# Patient Record
Sex: Female | Born: 1970
Health system: Southern US, Community
[De-identification: ages and names within clinical notes are randomized; demographics above are authoritative.]

## PROBLEM LIST (undated history)

## (undated) DIAGNOSIS — Q438 Other specified congenital malformations of intestine: Secondary | ICD-10-CM

## (undated) DIAGNOSIS — D126 Benign neoplasm of colon, unspecified: Secondary | ICD-10-CM

## (undated) DIAGNOSIS — R0600 Dyspnea, unspecified: Secondary | ICD-10-CM

## (undated) DIAGNOSIS — J329 Chronic sinusitis, unspecified: Secondary | ICD-10-CM

## (undated) DIAGNOSIS — S060X9A Concussion with loss of consciousness of unspecified duration, initial encounter: Secondary | ICD-10-CM

## (undated) DIAGNOSIS — Z8489 Family history of other specified conditions: Secondary | ICD-10-CM

## (undated) DIAGNOSIS — R51 Headache: Secondary | ICD-10-CM

## (undated) DIAGNOSIS — F419 Anxiety disorder, unspecified: Secondary | ICD-10-CM

## (undated) HISTORY — DX: Chronic sinusitis, unspecified: J32.9

## (undated) HISTORY — DX: Anxiety disorder, unspecified: F41.9

## (undated) HISTORY — PX: OTHER SURGICAL HISTORY: SHX169

## (undated) HISTORY — DX: Headache: R51

## (undated) HISTORY — DX: Benign neoplasm of colon, unspecified: D12.6

## (undated) HISTORY — DX: Other specified congenital malformations of intestine: Q43.8

## (undated) HISTORY — PX: NO PAST SURGERIES: SHX2092

---

## 2002-01-15 ENCOUNTER — Other Ambulatory Visit: Admission: RE | Admit: 2002-01-15 | Discharge: 2002-01-15 | Payer: Self-pay | Admitting: Obstetrics and Gynecology

## 2002-12-26 ENCOUNTER — Encounter: Payer: Self-pay | Admitting: Family Medicine

## 2002-12-26 ENCOUNTER — Encounter: Admission: RE | Admit: 2002-12-26 | Discharge: 2002-12-26 | Payer: Self-pay | Admitting: Family Medicine

## 2003-01-21 ENCOUNTER — Other Ambulatory Visit: Admission: RE | Admit: 2003-01-21 | Discharge: 2003-01-21 | Payer: Self-pay | Admitting: Obstetrics and Gynecology

## 2004-02-08 ENCOUNTER — Other Ambulatory Visit: Admission: RE | Admit: 2004-02-08 | Discharge: 2004-02-08 | Payer: Self-pay | Admitting: Obstetrics and Gynecology

## 2005-03-02 ENCOUNTER — Other Ambulatory Visit: Admission: RE | Admit: 2005-03-02 | Discharge: 2005-03-02 | Payer: Self-pay | Admitting: Obstetrics and Gynecology

## 2009-01-15 ENCOUNTER — Encounter: Admission: RE | Admit: 2009-01-15 | Discharge: 2009-01-15 | Payer: Self-pay | Admitting: Obstetrics and Gynecology

## 2010-01-17 ENCOUNTER — Encounter: Admission: RE | Admit: 2010-01-17 | Discharge: 2010-01-17 | Payer: Self-pay | Admitting: Obstetrics and Gynecology

## 2011-02-02 ENCOUNTER — Other Ambulatory Visit: Payer: Self-pay | Admitting: Obstetrics and Gynecology

## 2011-02-02 DIAGNOSIS — Z1231 Encounter for screening mammogram for malignant neoplasm of breast: Secondary | ICD-10-CM

## 2011-03-02 ENCOUNTER — Ambulatory Visit: Payer: Self-pay

## 2011-03-03 ENCOUNTER — Ambulatory Visit
Admission: RE | Admit: 2011-03-03 | Discharge: 2011-03-03 | Disposition: A | Discharge status: Home / Self Care | Payer: Managed Care, Other (non HMO) | Source: Ambulatory Visit | Attending: Obstetrics and Gynecology | Admitting: Obstetrics and Gynecology

## 2011-03-03 DIAGNOSIS — Z1231 Encounter for screening mammogram for malignant neoplasm of breast: Secondary | ICD-10-CM

## 2011-09-05 ENCOUNTER — Ambulatory Visit: Payer: Self-pay | Admitting: Obstetrics and Gynecology

## 2011-10-10 ENCOUNTER — Other Ambulatory Visit: Payer: Self-pay | Admitting: Obstetrics and Gynecology

## 2011-10-10 MED ORDER — NORGESTIM-ETH ESTRAD TRIPHASIC 0.18/0.215/0.25 MG-35 MCG PO TABS: 1.0000 | ORAL_TABLET | Freq: Every day | ORAL | Status: DC

## 2011-10-10 NOTE — Telephone Encounter (Signed)
Pt aex sch for August.  Refilled BCP 1 time with 1 refill.  ld

## 2011-10-10 NOTE — Telephone Encounter (Signed)
LAURA/SR PT °

## 2011-11-21 ENCOUNTER — Ambulatory Visit: Payer: Self-pay | Admitting: Obstetrics and Gynecology

## 2011-11-22 ENCOUNTER — Ambulatory Visit: Payer: Self-pay | Admitting: Obstetrics and Gynecology

## 2012-08-14 ENCOUNTER — Other Ambulatory Visit: Payer: Self-pay

## 2012-08-14 DIAGNOSIS — Z1231 Encounter for screening mammogram for malignant neoplasm of breast: Secondary | ICD-10-CM

## 2012-09-17 ENCOUNTER — Ambulatory Visit: Payer: Managed Care, Other (non HMO)

## 2012-12-31 ENCOUNTER — Ambulatory Visit (INDEPENDENT_AMBULATORY_CARE_PROVIDER_SITE_OTHER): Payer: Managed Care, Other (non HMO) | Admitting: General Practice

## 2012-12-31 ENCOUNTER — Other Ambulatory Visit: Payer: Self-pay | Admitting: General Practice

## 2012-12-31 ENCOUNTER — Encounter: Payer: Self-pay | Admitting: General Practice

## 2012-12-31 VITALS — BP 133/84 | HR 82 | Temp 98.5°F | Ht 63.0 in | Wt 143.0 lb

## 2012-12-31 DIAGNOSIS — Z23 Encounter for immunization: Secondary | ICD-10-CM

## 2012-12-31 DIAGNOSIS — Z0000 Encounter for general adult medical examination without abnormal findings: Secondary | ICD-10-CM

## 2012-12-31 DIAGNOSIS — Z01419 Encounter for gynecological examination (general) (routine) without abnormal findings: Secondary | ICD-10-CM

## 2012-12-31 DIAGNOSIS — Z124 Encounter for screening for malignant neoplasm of cervix: Secondary | ICD-10-CM

## 2012-12-31 LAB — POCT CBC
Granulocyte percent: 80.7 %G — AB (ref 37–80)
Hemoglobin: 12.8 g/dL (ref 12.2–16.2)
MCH, POC: 30.2 pg (ref 27–31.2)
MCV: 88.8 fL (ref 80–97)
MPV: 6.6 fL (ref 0–99.8)
POC Granulocyte: 4.3 (ref 2–6.9)
POC LYMPH PERCENT: 13.9 %L (ref 10–50)
Platelet Count, POC: 311 10*3/uL (ref 142–424)
RBC: 4.2 M/uL (ref 4.04–5.48)
RDW, POC: 13 %
WBC: 5.3 10*3/uL (ref 4.6–10.2)

## 2012-12-31 NOTE — Patient Instructions (Signed)

## 2012-12-31 NOTE — Progress Notes (Signed)
  Subjective:    Patient ID: Brenda Walker, female    DOB: [birth year], 42 y.o.   MRN: [id number]  HPI Patient presents today for annual physical. She denies any complaints or concerns at this time. Reports eating healthier and beginning a regular exercise routine. Reports taking an oral birth control contraceptive and tolerating well.     Review of Systems  Constitutional: Negative for fever and chills.  HENT: Negative for neck pain and neck stiffness.   Respiratory: Negative for chest tightness and shortness of breath.   Cardiovascular: Negative for chest pain.  Gastrointestinal: Negative for nausea, vomiting, abdominal pain, diarrhea, constipation and blood in stool.  Genitourinary: Negative for dysuria, hematuria and difficulty urinating.  Musculoskeletal: Negative for back pain.  Neurological: Negative for dizziness, weakness and headaches.       Objective:   Physical Exam  Constitutional: She is oriented to person, place, and time. She appears well-developed and well-nourished.  HENT:  Head: Normocephalic and atraumatic.  Right Ear: External ear normal.  Left Ear: External ear normal.  Nose: Nose normal.  Mouth/Throat: Oropharynx is clear and moist.  Eyes: Conjunctivae and EOM are normal. Pupils are equal, round, and reactive to light.  Neck: Normal range of motion. Neck supple. No thyromegaly present.  Cardiovascular: Normal rate, regular rhythm, normal heart sounds and intact distal pulses.   Pulmonary/Chest: Effort normal and breath sounds normal. No respiratory distress. She exhibits no tenderness. Right breast exhibits no inverted nipple, no mass, no nipple discharge, no skin change and no tenderness. Left breast exhibits no inverted nipple, no mass, no nipple discharge, no skin change and no tenderness. Breasts are symmetrical.  Abdominal: Soft. Bowel sounds are normal. She exhibits no distension. There is no tenderness.  Genitourinary: Vagina normal and uterus  normal. No breast swelling, tenderness, discharge or bleeding. No labial fusion. There is no rash, tenderness, lesion or injury on the right labia. There is no rash, tenderness, lesion or injury on the left labia. Uterus is not deviated, not enlarged, not fixed and not tender. Cervix exhibits no motion tenderness, no discharge and no friability. Right adnexum displays no mass, no tenderness and no fullness. Left adnexum displays no mass, no tenderness and no fullness. No erythema, tenderness or bleeding around the vagina. No foreign body around the vagina. No signs of injury around the vagina. No vaginal discharge found.  Musculoskeletal: She exhibits no edema and no tenderness.  Lymphadenopathy:    She has no cervical adenopathy.  Neurological: She is alert and oriented to person, place, and time.  Skin: Skin is warm and dry.  Psychiatric: She has a normal mood and affect.          Assessment & Plan:  1. Annual physical exam  - POCT CBC - CMP14+EGFR - Pap IG w/ reflex to HPV when ASC-U - NMR, lipoprofile  2. Need for prophylactic vaccination and inoculation against influenza -discussed healthy eating and regular exercise -RTO in 1 year for annual exam -Patient verbalized understanding -Coralie Keens, FNP-C

## 2013-01-01 ENCOUNTER — Ambulatory Visit: Payer: Managed Care, Other (non HMO)

## 2013-01-01 ENCOUNTER — Other Ambulatory Visit: Payer: Self-pay | Admitting: General Practice

## 2013-01-02 LAB — CMP14+EGFR
ALT: 17 IU/L (ref 0–32)
Albumin/Globulin Ratio: 1.5 (ref 1.1–2.5)
Albumin: 4.2 g/dL (ref 3.5–5.5)
Alkaline Phosphatase: 75 IU/L (ref 39–117)
BUN/Creatinine Ratio: 13 (ref 9–23)
BUN: 11 mg/dL (ref 6–24)
CO2: 22 mmol/L (ref 18–29)
Calcium: 9.9 mg/dL (ref 8.7–10.2)
Chloride: 103 mmol/L (ref 97–108)
Globulin, Total: 2.8 g/dL (ref 1.5–4.5)
Glucose: 90 mg/dL (ref 65–99)

## 2013-01-02 LAB — NMR, LIPOPROFILE
Cholesterol: 183 mg/dL (ref <200)
HDL Particle Number: 44.2 umol/L (ref >=30.5)
LDL Particle Number: 1584 nmol/L — ABNORMAL HIGH (ref <1000)
LDL Size: 20.9 nm (ref >20.5)
LDLC SERPL CALC-MCNC: 101 mg/dL — ABNORMAL HIGH (ref <100)
Triglycerides by NMR: 95 mg/dL (ref <150)

## 2013-01-03 ENCOUNTER — Other Ambulatory Visit: Payer: Self-pay | Admitting: General Practice

## 2013-01-03 ENCOUNTER — Ambulatory Visit
Admission: RE | Admit: 2013-01-03 | Discharge: 2013-01-03 | Disposition: A | Discharge status: Home / Self Care | Payer: Managed Care, Other (non HMO) | Source: Ambulatory Visit

## 2013-01-03 ENCOUNTER — Telehealth: Payer: Self-pay | Admitting: General Practice

## 2013-01-03 DIAGNOSIS — IMO0001 Reserved for inherently not codable concepts without codable children: Secondary | ICD-10-CM

## 2013-01-03 DIAGNOSIS — Z1231 Encounter for screening mammogram for malignant neoplasm of breast: Secondary | ICD-10-CM

## 2013-01-03 LAB — PAP IG W/ RFLX HPV ASCU: PAP Smear Comment: 0

## 2013-01-03 MED ORDER — NORGESTIM-ETH ESTRAD TRIPHASIC 0.18/0.215/0.25 MG-35 MCG PO TABS: 1.0000 | ORAL_TABLET | Freq: Every day | ORAL | Status: DC

## 2013-01-03 NOTE — Telephone Encounter (Signed)
Already sent.

## 2013-01-03 NOTE — Telephone Encounter (Signed)
done

## 2013-01-16 ENCOUNTER — Ambulatory Visit (INDEPENDENT_AMBULATORY_CARE_PROVIDER_SITE_OTHER): Payer: Managed Care, Other (non HMO) | Admitting: General Practice

## 2013-01-16 ENCOUNTER — Encounter: Payer: Self-pay | Admitting: General Practice

## 2013-01-16 ENCOUNTER — Telehealth: Payer: Self-pay | Admitting: General Practice

## 2013-01-16 VITALS — BP 119/74 | HR 83 | Temp 97.0°F | Ht 63.0 in | Wt 142.5 lb

## 2013-01-16 DIAGNOSIS — N390 Urinary tract infection, site not specified: Secondary | ICD-10-CM

## 2013-01-16 DIAGNOSIS — IMO0001 Reserved for inherently not codable concepts without codable children: Secondary | ICD-10-CM

## 2013-01-16 DIAGNOSIS — R350 Frequency of micturition: Secondary | ICD-10-CM

## 2013-01-16 LAB — POCT UA - MICROSCOPIC ONLY
Bacteria, U Microscopic: NEGATIVE
Crystals, Ur, HPF, POC: NEGATIVE
Mucus, UA: NEGATIVE

## 2013-01-16 LAB — POCT URINALYSIS DIPSTICK
Bilirubin, UA: NEGATIVE
Glucose, UA: NEGATIVE
Ketones, UA: NEGATIVE
Spec Grav, UA: 1.01

## 2013-01-16 MED ORDER — CIPROFLOXACIN HCL 500 MG PO TABS: 500.0000 mg | ORAL_TABLET | Freq: Two times a day (BID) | ORAL | Status: DC

## 2013-01-16 NOTE — Patient Instructions (Signed)
Urinary Tract Infection  Urinary tract infections (UTIs) can develop anywhere along your urinary tract. Your urinary tract is your body's drainage system for removing wastes and extra water. Your urinary tract includes two kidneys, two ureters, a bladder, and a urethra. Your kidneys are a pair of bean-shaped organs. Each kidney is about the size of your fist. They are located below your ribs, one on each side of your spine.  CAUSES  Infections are caused by microbes, which are microscopic organisms, including fungi, viruses, and bacteria. These organisms are so small that they can only be seen through a microscope. Bacteria are the microbes that most commonly cause UTIs.  SYMPTOMS   Symptoms of UTIs may vary by age and gender of the patient and by the location of the infection. Symptoms in young women typically include a frequent and intense urge to urinate and a painful, burning feeling in the bladder or urethra during urination. Older women and men are more likely to be tired, shaky, and weak and have muscle aches and abdominal pain. A fever may mean the infection is in your kidneys. Other symptoms of a kidney infection include pain in your back or sides below the ribs, nausea, and vomiting.  DIAGNOSIS  To diagnose a UTI, your caregiver will ask you about your symptoms. Your caregiver also will ask to provide a urine sample. The urine sample will be tested for bacteria and white blood cells. White blood cells are made by your body to help fight infection.  TREATMENT   Typically, UTIs can be treated with medication. Because most UTIs are caused by a bacterial infection, they usually can be treated with the use of antibiotics. The choice of antibiotic and length of treatment depend on your symptoms and the type of bacteria causing your infection.  HOME CARE INSTRUCTIONS   If you were prescribed antibiotics, take them exactly as your caregiver instructs you. Finish the medication even if you feel better after you  have only taken some of the medication.   Drink enough water and fluids to keep your urine clear or pale yellow.   Avoid caffeine, tea, and carbonated beverages. They tend to irritate your bladder.   Empty your bladder often. Avoid holding urine for long periods of time.   Empty your bladder before and after sexual intercourse.   After a bowel movement, women should cleanse from front to back. Use each tissue only once.  SEEK MEDICAL CARE IF:    You have back pain.   You develop a fever.   Your symptoms do not begin to resolve within 3 days.  SEEK IMMEDIATE MEDICAL CARE IF:    You have severe back pain or lower abdominal pain.   You develop chills.   You have nausea or vomiting.   You have continued burning or discomfort with urination.  MAKE SURE YOU:    Understand these instructions.   Will watch your condition.   Will get help right away if you are not doing well or get worse.  Document Released: 12/21/2004 Document Revised: 09/12/2011 Document Reviewed: 04/21/2011  Exit[employer] Patient Information 2014 [employer].

## 2013-01-16 NOTE — Telephone Encounter (Signed)
Appt given for today 

## 2013-01-16 NOTE — Progress Notes (Signed)
  Subjective:    Patient ID: Brenda Walker, female    DOB: [birth year], 42 y.o.   MRN: [id number]  Urinary Tract Infection  This is a new problem. The current episode started in the past 7 days. The problem occurs intermittently. The problem has been unchanged. There has been no fever. She is sexually active. There is no history of pyelonephritis. Associated symptoms include frequency and urgency. Pertinent negatives include no chills or hematuria. She has tried nothing for the symptoms. There is no history of kidney stones or recurrent UTIs.  Reports last menstrual cycle was 01/02/13 and regular cycle.     Review of Systems  Constitutional: Negative for fever and chills.  Respiratory: Negative for chest tightness and shortness of breath.   Cardiovascular: Negative for chest pain.  Genitourinary: Positive for urgency and frequency. Negative for dysuria, hematuria and difficulty urinating.  Neurological: Negative for dizziness, weakness and headaches.       Objective:   Physical Exam  Constitutional: She is oriented to person, place, and time. She appears well-developed and well-nourished.  Cardiovascular: Normal rate, regular rhythm and normal heart sounds.   Pulmonary/Chest: Effort normal and breath sounds normal.  Abdominal: Soft. Bowel sounds are normal. She exhibits no distension. There is tenderness in the suprapubic area. There is no CVA tenderness.  Neurological: She is alert and oriented to person, place, and time.  Skin: Skin is warm and dry.  Psychiatric: She has a normal mood and affect.     Results for orders placed in visit on 01/16/13  POCT URINALYSIS DIPSTICK      Result Value Range   Color, UA clear     Clarity, UA slightly cloudy     Glucose, UA neg     Bilirubin, UA neg     Ketones, UA neg     Spec Grav, UA 1.010     Blood, UA large     pH, UA 6.0     Protein, UA neg     Urobilinogen, UA negative     Nitrite, UA neg     Leukocytes, UA large (3+)     POCT UA - MICROSCOPIC ONLY      Result Value Range   WBC, Ur, HPF, POC 80-150     RBC, urine, microscopic 10-20     Bacteria, U Microscopic neg     Mucus, UA neg     Epithelial cells, urine per micros few     Crystals, Ur, HPF, POC neg     Casts, Ur, LPF, POC neg     Yeast, UA neg          Assessment & Plan:  1. Frequency  - POCT urinalysis dipstick - POCT UA - Microscopic Only  2. UTI (urinary tract infection)  - ciprofloxacin (CIPRO) 500 MG tablet; Take 1 tablet (500 mg total) by mouth 2 (two) times daily.  Dispense: 20 tablet; Refill: 0 - POCT UA - Microscopic Only; Future - POCT urinalysis dipstick; Future Increase fluid intake AZO over the counter X2 days Frequent voiding Proper perineal hygiene RTO prn and repeat U/A in 2 weeks Patient verbalized understanding Coralie Keens, FNP-C

## 2013-01-28 ENCOUNTER — Telehealth: Payer: Self-pay | Admitting: General Practice

## 2013-01-28 ENCOUNTER — Other Ambulatory Visit: Payer: Self-pay | Admitting: General Practice

## 2013-01-30 NOTE — Telephone Encounter (Signed)
Please verify what problem is.

## 2013-02-19 ENCOUNTER — Other Ambulatory Visit: Payer: Self-pay | Admitting: General Practice

## 2013-02-19 DIAGNOSIS — IMO0001 Reserved for inherently not codable concepts without codable children: Secondary | ICD-10-CM

## 2013-02-19 MED ORDER — NORGESTIM-ETH ESTRAD TRIPHASIC 0.18/0.215/0.25 MG-35 MCG PO TABS: 1.0000 | ORAL_TABLET | Freq: Every day | ORAL | Status: DC

## 2013-02-19 NOTE — Telephone Encounter (Signed)
Please take care of at son's visit.

## 2013-07-02 ENCOUNTER — Telehealth: Payer: Self-pay | Admitting: General Practice

## 2013-07-02 NOTE — Telephone Encounter (Signed)
I don't see that she had a visit last week and I'm not positive what the message is asking. Left message for patient to call back.

## 2013-07-02 NOTE — Telephone Encounter (Signed)
This was taken care of in a different encounter.

## 2013-07-02 NOTE — Telephone Encounter (Signed)
Patient mentioned to [facility] during son's appt that she was having migraines. She has used Climara patch during menstrual cycle to help decrease migraines. Appt scheduled with Mae for later this month.

## 2013-07-15 ENCOUNTER — Encounter: Payer: Self-pay | Admitting: General Practice

## 2013-07-15 ENCOUNTER — Telehealth: Payer: Self-pay | Admitting: General Practice

## 2013-07-15 ENCOUNTER — Ambulatory Visit (INDEPENDENT_AMBULATORY_CARE_PROVIDER_SITE_OTHER): Payer: Managed Care, Other (non HMO) | Admitting: General Practice

## 2013-07-15 VITALS — BP 128/79 | HR 100 | Temp 98.6°F | Ht 63.0 in | Wt 138.4 lb

## 2013-07-15 DIAGNOSIS — F411 Generalized anxiety disorder: Secondary | ICD-10-CM

## 2013-07-15 DIAGNOSIS — R51 Headache: Secondary | ICD-10-CM

## 2013-07-15 MED ORDER — ESCITALOPRAM OXALATE 10 MG PO TABS: 10.0000 mg | ORAL_TABLET | Freq: Every day | ORAL | Status: DC

## 2013-07-15 NOTE — Patient Instructions (Signed)

## 2013-07-15 NOTE — Progress Notes (Signed)
   Subjective:    Patient ID: Brenda Walker, female    DOB: [birth year], 43 y.o.   MRN: [id number]  Headache  This is a new problem. The current episode started 1 to 4 weeks ago. The problem occurs intermittently. The problem has been unchanged. The pain is located in the right unilateral region. The pain does not radiate. The pain quality is similar to prior headaches. The quality of the pain is described as aching. The pain is at a severity of 3/10. Pertinent negatives include no blurred vision, dizziness, fever, insomnia, phonophobia, photophobia, tingling or weakness. The symptoms are aggravated by emotional stress and work.  Patient reports onset of headaches occur when she becomes more anxious, which seems to be work related. Also report racing thoughts at times. Denies taking OTC medications.     Review of Systems  Constitutional: Negative for fever and chills.  Eyes: Negative for blurred vision and photophobia.  Respiratory: Negative for chest tightness and shortness of breath.   Cardiovascular: Negative for chest pain and palpitations.  Neurological: Positive for headaches. Negative for dizziness, tingling and weakness.  Psychiatric/Behavioral: Negative for suicidal ideas, sleep disturbance and self-injury. The patient is nervous/anxious. The patient does not have insomnia.        Objective:   Physical Exam  Constitutional: She is oriented to person, place, and time. She appears well-developed and well-nourished.  HENT:  Head: Normocephalic and atraumatic.  Right Ear: External ear normal.  Left Ear: External ear normal.  Eyes: EOM are normal. Pupils are equal, round, and reactive to light.  Neck: Normal range of motion. Neck supple. No thyromegaly present.  Cardiovascular: Normal rate, regular rhythm and normal heart sounds.   Pulmonary/Chest: Effort normal and breath sounds normal. No respiratory distress. She exhibits no tenderness.  Abdominal: Soft. Bowel sounds are  normal. She exhibits no distension. There is no tenderness.  Neurological: She is alert and oriented to person, place, and time.  Skin: Skin is warm and dry.  Psychiatric: She has a normal mood and affect.          Assessment & Plan:  1. GAD (generalized anxiety disorder)  - escitalopram (LEXAPRO) 10 MG tablet; Take 1 tablet (10 mg total) by mouth daily.  Dispense: 30 tablet; Refill: 0 -keep diary of headaches -discussed relaxation techniques -RTO in 1 week for follow up antianxiety medication Patient verbalized understanding Erby Pian, FNP-C

## 2013-07-16 NOTE — Telephone Encounter (Signed)
appt given for 4/30

## 2013-07-20 DIAGNOSIS — R51 Headache: Secondary | ICD-10-CM

## 2013-07-20 DIAGNOSIS — R519 Headache, unspecified: Secondary | ICD-10-CM | POA: Insufficient documentation

## 2013-07-24 ENCOUNTER — Other Ambulatory Visit: Payer: Self-pay | Admitting: General Practice

## 2013-07-24 ENCOUNTER — Ambulatory Visit (INDEPENDENT_AMBULATORY_CARE_PROVIDER_SITE_OTHER): Payer: Managed Care, Other (non HMO) | Admitting: General Practice

## 2013-07-24 ENCOUNTER — Encounter: Payer: Self-pay | Admitting: General Practice

## 2013-07-24 VITALS — BP 135/80 | HR 93 | Temp 97.0°F | Ht 63.0 in | Wt 136.0 lb

## 2013-07-24 DIAGNOSIS — G44009 Cluster headache syndrome, unspecified, not intractable: Secondary | ICD-10-CM

## 2013-07-24 DIAGNOSIS — F411 Generalized anxiety disorder: Secondary | ICD-10-CM

## 2013-07-24 DIAGNOSIS — R232 Flushing: Secondary | ICD-10-CM

## 2013-07-24 MED ORDER — ESCITALOPRAM OXALATE 10 MG PO TABS: 5.0000 mg | ORAL_TABLET | Freq: Every day | ORAL | Status: DC

## 2013-07-24 MED ORDER — ESTRADIOL 0.025 MG/24HR TD PTWK: MEDICATED_PATCH | TRANSDERMAL | Status: DC

## 2013-07-24 NOTE — Patient Instructions (Signed)

## 2013-07-24 NOTE — Progress Notes (Signed)
   Subjective:    Patient ID: Brenda Walker, female    DOB: [birth year], 43 y.o.   MRN: [id number]  HPI Patient presents today for follow up on anxiety. She was started on lexapro 10mg  on last visit (one week ago). Reports 10mg  made her very drowsy, but cut in half for 5mg  and that dose is very effective. Denies having anxiety or panic attacks. Able to concentrate and complete tasks. Denies thoughts of harming self or others.     Review of Systems  Constitutional: Negative for fever and chills.  Respiratory: Negative for chest tightness and shortness of breath.   Cardiovascular: Negative for chest pain and palpitations.  Neurological: Negative for dizziness, weakness and headaches.       Objective:   Physical Exam  Constitutional: She is oriented to person, place, and time. She appears well-developed and well-nourished.  Cardiovascular: Normal rate, regular rhythm and normal heart sounds.   Pulmonary/Chest: Effort normal and breath sounds normal. No respiratory distress. She exhibits no tenderness.  Neurological: She is alert and oriented to person, place, and time.  Skin: Skin is warm and dry.  Psychiatric: She has a normal mood and affect.          Assessment & Plan:  1. GAD (generalized anxiety disorder) - escitalopram (LEXAPRO) 10 MG tablet; Take 0.5 tablets (5 mg total) by mouth daily.  Dispense: 30 tablet; Refill: 5 -relaxation techniques discussed -discussed side effects and when to seek medical treatment -RTO in 3 months for follow up -Patient verbalized understanding Erby Pian, FNP-C

## 2013-10-01 ENCOUNTER — Telehealth: Payer: Self-pay | Admitting: General Practice

## 2013-10-02 NOTE — Telephone Encounter (Signed)
Ok for 90 day supply.  

## 2013-10-03 NOTE — Telephone Encounter (Signed)
Left message at pharmacy that it was okay told  to call back if any qeustions

## 2013-10-04 ENCOUNTER — Other Ambulatory Visit: Payer: Self-pay | Admitting: Nurse Practitioner

## 2013-10-04 DIAGNOSIS — F411 Generalized anxiety disorder: Secondary | ICD-10-CM

## 2013-10-04 MED ORDER — ESCITALOPRAM OXALATE 10 MG PO TABS: 5.0000 mg | ORAL_TABLET | Freq: Every day | ORAL | Status: DC

## 2014-01-26 ENCOUNTER — Encounter: Payer: Self-pay | Admitting: General Practice

## 2014-04-08 ENCOUNTER — Other Ambulatory Visit: Payer: Self-pay | Admitting: Nurse Practitioner

## 2014-04-08 MED ORDER — NORGESTIM-ETH ESTRAD TRIPHASIC 0.18/0.215/0.25 MG-35 MCG PO TABS: 1.0000 | ORAL_TABLET | Freq: Every day | ORAL | Status: DC

## 2014-04-08 NOTE — Telephone Encounter (Signed)
Done, lmom for patient

## 2014-04-09 ENCOUNTER — Other Ambulatory Visit: Payer: Self-pay | Admitting: General Practice

## 2014-05-21 ENCOUNTER — Other Ambulatory Visit: Payer: Self-pay | Admitting: Nurse Practitioner

## 2014-05-22 NOTE — Telephone Encounter (Signed)
Last seen 01/13/13

## 2014-06-02 ENCOUNTER — Ambulatory Visit (INDEPENDENT_AMBULATORY_CARE_PROVIDER_SITE_OTHER): Payer: Managed Care, Other (non HMO) | Admitting: Nurse Practitioner

## 2014-06-02 ENCOUNTER — Encounter: Payer: Self-pay | Admitting: Nurse Practitioner

## 2014-06-02 VITALS — BP 106/65 | HR 78 | Temp 97.2°F | Ht 63.0 in | Wt 143.0 lb

## 2014-06-02 DIAGNOSIS — Z0000 Encounter for general adult medical examination without abnormal findings: Secondary | ICD-10-CM

## 2014-06-02 DIAGNOSIS — G43909 Migraine, unspecified, not intractable, without status migrainosus: Secondary | ICD-10-CM

## 2014-06-02 DIAGNOSIS — R232 Flushing: Secondary | ICD-10-CM | POA: Diagnosis not present

## 2014-06-02 DIAGNOSIS — Z01419 Encounter for gynecological examination (general) (routine) without abnormal findings: Secondary | ICD-10-CM | POA: Diagnosis not present

## 2014-06-02 DIAGNOSIS — G44009 Cluster headache syndrome, unspecified, not intractable: Secondary | ICD-10-CM

## 2014-06-02 LAB — POCT URINALYSIS DIPSTICK
Bilirubin, UA: NEGATIVE
Blood, UA: NEGATIVE
Glucose, UA: NEGATIVE
KETONES UA: NEGATIVE
Leukocytes, UA: NEGATIVE
Nitrite, UA: NEGATIVE
Protein, UA: NEGATIVE
Spec Grav, UA: <=1.005
Urobilinogen, UA: NEGATIVE
pH, UA: 7

## 2014-06-02 LAB — POCT CBC
GRANULOCYTE PERCENT: 66.2 % (ref 37–80)
HEMATOCRIT: 37.1 % — AB (ref 37.7–47.9)
Hemoglobin: 11.8 g/dL — AB (ref 12.2–16.2)
Lymph, poc: 1.1 (ref 0.6–3.4)
MCH, POC: 29.4 pg (ref 27–31.2)
MCHC: 31.8 g/dL (ref 31.8–35.4)
MCV: 92.2 fL (ref 80–97)
MPV: 7 fL (ref 0–99.8)
POC Granulocyte: 3 (ref 2–6.9)
POC LYMPH PERCENT: 24.3 %L (ref 10–50)
Platelet Count, POC: 287 10*3/uL (ref 142–424)
RBC: 4.02 M/uL — AB (ref 4.04–5.48)
RDW, POC: 12.3 %
WBC: 4.6 10*3/uL (ref 4.6–10.2)

## 2014-06-02 LAB — POCT UA - MICROSCOPIC ONLY
BACTERIA, U MICROSCOPIC: NEGATIVE
CASTS, UR, LPF, POC: NEGATIVE
Crystals, Ur, HPF, POC: NEGATIVE
MUCUS UA: NEGATIVE
RBC, urine, microscopic: NEGATIVE
WBC, UR, HPF, POC: NEGATIVE

## 2014-06-02 MED ORDER — NORGESTIM-ETH ESTRAD TRIPHASIC 0.18/0.215/0.25 MG-35 MCG PO TABS: 1.0000 | ORAL_TABLET | Freq: Every day | ORAL | Status: DC

## 2014-06-02 MED ORDER — ESTRADIOL 0.025 MG/24HR TD PTWK: MEDICATED_PATCH | TRANSDERMAL | Status: DC

## 2014-06-02 NOTE — Addendum Note (Signed)
Addended by: Selmer Dominion on: 06/02/2014 04:50 PM   Modules accepted: Orders

## 2014-06-02 NOTE — Addendum Note (Signed)
Addended by: Earlene Plater on: 06/02/2014 04:48 PM   Modules accepted: Miquel Dunn

## 2014-06-02 NOTE — Progress Notes (Signed)
Subjective:    Patient ID: Brenda Walker, female    DOB: 09-08-1970, 44 y.o.   MRN: [id number]  HPI Patient is here today for CPE with pap. Patient reports doing well, no acute complaint today.  Headache:    Have the headache sporadically and is using trisprintec along with climara patch to help decrease headache incidence.  GAD: stop taking lexapro 77m and is doing wel,.    Review of Systems  Constitutional: Negative.   HENT: Negative.   Eyes: Negative.   Respiratory: Negative.   Cardiovascular: Negative.   Gastrointestinal: Negative.   Endocrine: Negative.   Genitourinary: Negative.   Musculoskeletal: Negative.   Skin: Negative.   Allergic/Immunologic: Negative.   Neurological: Negative.   Hematological: Negative.   Psychiatric/Behavioral: Negative.        Objective:   Physical Exam  Constitutional: She is oriented to person, place, and time. She appears well-developed and well-nourished.  HENT:  Head: Normocephalic.  Right Ear: Hearing, tympanic membrane, external ear and ear canal normal.  Left Ear: Hearing, tympanic membrane, external ear and ear canal normal.  Nose: Nose normal.  Mouth/Throat: Uvula is midline and oropharynx is clear and moist.  Eyes: Conjunctivae and EOM are normal. Pupils are equal, round, and reactive to light.  Neck: Normal range of motion and full passive range of motion without pain. Neck supple. No JVD present. Carotid bruit is not present. No thyroid mass and no thyromegaly present.  Cardiovascular: Normal rate, normal heart sounds and intact distal pulses.   No murmur heard. Pulmonary/Chest: Effort normal and breath sounds normal. Right breast exhibits no inverted nipple, no mass, no nipple discharge, no skin change and no tenderness. Left breast exhibits no inverted nipple, no mass, no nipple discharge, no skin change and no tenderness.  Abdominal: Soft. Bowel sounds are normal. She exhibits no mass. There is no tenderness.    Genitourinary: Vagina normal and uterus normal. No breast swelling, tenderness, discharge or bleeding.  bimanual exam-No adnexal masses or tenderness. Cervix parous and pink no discharge non thrombosed external hemorrhoids  Musculoskeletal: Normal range of motion.  Lymphadenopathy:    She has no cervical adenopathy.  Neurological: She is alert and oriented to person, place, and time.  Skin: Skin is warm and dry.  Psychiatric: She has a normal mood and affect. Her behavior is normal. Judgment and thought content normal.    BP 106/65 mmHg  Pulse 78  Temp(Src) 97.2 F (36.2 C) (Oral)  Ht _0  (1.6 m)  Wt 143 lb (64.864 kg)  BMI 25.34 kg/m2       Assessment & Plan:  1. Annual physical exam  - POCT urinalysis dipstick - POCT UA - Microscopic Only - NMR, lipoprofile - CMP14+EGFR - Thyroid Panel With TSH - CBC with Differential/Platelet - Vit D  25 hydroxy (rtn osteoporosis monitoring)  2. Encounter for routine gynecological examination  - Pap IG w/ reflex to HPV when ASC-U  3. Vasomotor headache  - estradiol (CLIMARA - DOSED IN MG/24 HR) 0.025 mg/24hr patch; Apply one patch (0.0216m to skin during week of menstrual cycle, then remove.  Dispense: 4 patch; Refill: 3  4. Vasomotor flushing  - estradiol (CLIMARA - DOSED IN MG/24 HR) 0.025 mg/24hr patch; Apply one patch (0.02575mto skin during week of menstrual cycle, then remove.  Dispense: 4 patch; Refill: 3    Labs pending Health maintenance reviewed Diet and exercise encouraged Continue all meds Follow up  In 1 year   [place]  FNP    

## 2014-06-02 NOTE — Patient Instructions (Signed)
Pap Test A Pap test is a procedure done in a clinic office to evaluate cells that are on the surface of the cervix. The cervix is the lower portion of the uterus and upper portion of the vagina. For some women, the cervical region has the potential to form cancer. With consistent evaluations by your caregiver, this type of cancer can be prevented.  If a Pap test is abnormal, it is most often a result of a previous exposure to human papillomavirus (HPV). HPV is a virus that can infect the cells of the cervix and cause dysplasia. Dysplasia is where the cells no longer look normal. If a woman has been diagnosed with high-grade or severe dysplasia, they are at higher risk of developing cervical cancer. People diagnosed with low-grade dysplasia should still be seen by their caregiver because there is a small chance that low-grade dysplasia could develop into cancer.  LET YOUR CAREGIVER KNOW ABOUT:  Recent sexually transmitted infection (STI) you have had.  Any new sex partners you have had.  History of previous abnormal Pap tests results.  History of previous cervical procedures you have had (colposcopy, biopsy, loop electrosurgical excision procedure [LEEP]).  Concerns you have had regarding unusual vaginal discharge.  History of pelvic pain.  Your use of birth control. BEFORE THE PROCEDURE  Ask your caregiver when to schedule your Pap test. It is best not to be on your period if your caregiver uses a wooden spatula to collect cells or applies cells to a glass slide. Newer techniques are not so sensitive to the timing of a menstrual cycle.  Do not douche or have sexual intercourse for 24 hours before the test.   Do not use vaginal creams or tampons for 24 hours before the test.   Empty your bladder just before the test to lessen any discomfort.  PROCEDURE You will lie on an exam table with your feet in stirrups. A warm metal or plastic instrument (speculum) is placed in your vagina. This  instrument allows your caregiver to see the inside of your vagina and look at your cervix. A small, plastic brush or wooden spatula is then used to collect cervical cells. These cells are placed in a lab specimen container. The cells are looked at under a microscope. A specialist will determine if the cells are normal.  AFTER THE PROCEDURE Make sure to get your test results.If your results come back abnormal, you may need further testing.  Document Released: 06/03/2002 Document Revised: 06/05/2011 Document Reviewed: 03/09/2011 [employer] Patient Information 2015 [employer]. This information is not intended to replace advice given to you by your health care provider. Make sure you discuss any questions you have with your health care provider.  

## 2014-06-03 LAB — NMR, LIPOPROFILE
Cholesterol: 167 mg/dL (ref 100–199)
HDL Cholesterol by NMR: 65 mg/dL
HDL Particle Number: 44.5 umol/L
LDL Particle Number: 1129 nmol/L — ABNORMAL HIGH
LDL Size: 20.8 nm
LDL-C: 81 mg/dL (ref 0–99)
LP-IR Score: 32
Small LDL Particle Number: 487 nmol/L
Triglycerides by NMR: 105 mg/dL (ref 0–149)

## 2014-06-03 LAB — THYROID PANEL WITH TSH
Free Thyroxine Index: 1.8 (ref 1.2–4.9)
T3 Uptake Ratio: 19 % — ABNORMAL LOW (ref 24–39)
T4, Total: 9.7 ug/dL (ref 4.5–12.0)
TSH: 2.48 u[IU]/mL (ref 0.450–4.500)

## 2014-06-03 LAB — CMP14+EGFR
ALT: 15 IU/L (ref 0–32)
AST: 16 IU/L (ref 0–40)
Albumin/Globulin Ratio: 1.4 (ref 1.1–2.5)
Albumin: 3.9 g/dL (ref 3.5–5.5)
Alkaline Phosphatase: 63 IU/L (ref 39–117)
BUN/Creatinine Ratio: 11 (ref 9–23)
BUN: 8 mg/dL (ref 6–24)
Bilirubin Total: <0.2 mg/dL (ref 0.0–1.2)
CO2: 23 mmol/L (ref 18–29)
Calcium: 9.3 mg/dL (ref 8.7–10.2)
Chloride: 102 mmol/L (ref 97–108)
Creatinine, Ser: 0.76 mg/dL (ref 0.57–1.00)
GFR calc Af Amer: 109 mL/min/1.73
GFR calc non Af Amer: 94 mL/min/1.73
Globulin, Total: 2.8 g/dL (ref 1.5–4.5)
Glucose: 81 mg/dL (ref 65–99)
Potassium: 4.5 mmol/L (ref 3.5–5.2)
Sodium: 137 mmol/L (ref 134–144)
Total Protein: 6.7 g/dL (ref 6.0–8.5)

## 2014-06-03 LAB — VITAMIN D 25 HYDROXY (VIT D DEFICIENCY, FRACTURES): Vit D, 25-Hydroxy: 31.5 ng/mL (ref 30.0–100.0)

## 2014-06-05 LAB — PAP IG W/ RFLX HPV ASCU: PAP SMEAR COMMENT: 0

## 2014-10-27 ENCOUNTER — Other Ambulatory Visit: Payer: Self-pay

## 2014-10-27 DIAGNOSIS — K229 Disease of esophagus, unspecified: Secondary | ICD-10-CM

## 2014-10-27 DIAGNOSIS — Z1231 Encounter for screening mammogram for malignant neoplasm of breast: Secondary | ICD-10-CM

## 2014-10-30 ENCOUNTER — Other Ambulatory Visit: Payer: Self-pay | Admitting: Nurse Practitioner

## 2014-12-08 ENCOUNTER — Ambulatory Visit: Payer: Managed Care, Other (non HMO)

## 2015-01-13 ENCOUNTER — Ambulatory Visit: Payer: Managed Care, Other (non HMO)

## 2015-03-18 ENCOUNTER — Ambulatory Visit (INDEPENDENT_AMBULATORY_CARE_PROVIDER_SITE_OTHER): Payer: Managed Care, Other (non HMO) | Admitting: Family

## 2015-03-18 ENCOUNTER — Encounter: Payer: Self-pay | Admitting: Family

## 2015-03-18 VITALS — BP 124/81 | HR 74 | Temp 97.3°F | Ht 63.0 in | Wt 152.6 lb

## 2015-03-18 DIAGNOSIS — J0100 Acute maxillary sinusitis, unspecified: Secondary | ICD-10-CM

## 2015-03-18 DIAGNOSIS — J309 Allergic rhinitis, unspecified: Secondary | ICD-10-CM

## 2015-03-18 DIAGNOSIS — F411 Generalized anxiety disorder: Secondary | ICD-10-CM | POA: Diagnosis not present

## 2015-03-18 MED ORDER — ESCITALOPRAM OXALATE 10 MG PO TABS: ORAL_TABLET | ORAL | Status: DC

## 2015-03-18 MED ORDER — AZITHROMYCIN 250 MG PO TABS: ORAL_TABLET | ORAL | Status: DC

## 2015-03-18 NOTE — Patient Instructions (Signed)
Sinusitis, Adult Sinusitis is redness, soreness, and inflammation of the paranasal sinuses. Paranasal sinuses are air pockets within the bones of your face. They are located beneath your eyes, in the middle of your forehead, and above your eyes. In healthy paranasal sinuses, mucus is able to drain out, and air is able to circulate through them by way of your nose. However, when your paranasal sinuses are inflamed, mucus and air can become trapped. This can allow bacteria and other germs to grow and cause infection. Sinusitis can develop quickly and last only a short time (acute) or continue over a long period (chronic). Sinusitis that lasts for more than 12 weeks is considered chronic. CAUSES Causes of sinusitis include:  Allergies.  Structural abnormalities, such as displacement of the cartilage that separates your nostrils (deviated septum), which can decrease the air flow through your nose and sinuses and affect sinus drainage.  Functional abnormalities, such as when the small hairs (cilia) that line your sinuses and help remove mucus do not work properly or are not present. SIGNS AND SYMPTOMS Symptoms of acute and chronic sinusitis are the same. The primary symptoms are pain and pressure around the affected sinuses. Other symptoms include:  Upper toothache.  Earache.  Headache.  Bad breath.  Decreased sense of smell and taste.  A cough, which worsens when you are lying flat.  Fatigue.  Fever.  Thick drainage from your nose, which often is green and may contain pus (purulent).  Swelling and warmth over the affected sinuses. DIAGNOSIS Your health care provider will perform a physical exam. During your exam, your health care provider may perform any of the following to help determine if you have acute sinusitis or chronic sinusitis:  Look in your nose for signs of abnormal growths in your nostrils (nasal polyps).  Tap over the affected sinus to check for signs of  infection.  View the inside of your sinuses using an imaging device that has a light attached (endoscope). If your health care provider suspects that you have chronic sinusitis, one or more of the following tests may be recommended:  Allergy tests.  Nasal culture. A sample of mucus is taken from your nose, sent to a lab, and screened for bacteria.  Nasal cytology. A sample of mucus is taken from your nose and examined by your health care provider to determine if your sinusitis is related to an allergy. TREATMENT Most cases of acute sinusitis are related to a viral infection and will resolve on their own within 10 days. Sometimes, medicines are prescribed to help relieve symptoms of both acute and chronic sinusitis. These may include pain medicines, decongestants, nasal steroid sprays, or saline sprays. However, for sinusitis related to a bacterial infection, your health care provider will prescribe antibiotic medicines. These are medicines that will help kill the bacteria causing the infection. Rarely, sinusitis is caused by a fungal infection. In these cases, your health care provider will prescribe antifungal medicine. For some cases of chronic sinusitis, surgery is needed. Generally, these are cases in which sinusitis recurs more than 3 times per year, despite other treatments. HOME CARE INSTRUCTIONS  Drink plenty of water. Water helps thin the mucus so your sinuses can drain more easily.  Use a humidifier.  Inhale steam 3-4 times a day (for example, sit in the bathroom with the shower running).  Apply a warm, moist washcloth to your face 3-4 times a day, or as directed by your health care provider.  Use saline nasal sprays to help  moisten and clean your sinuses.  Take medicines only as directed by your health care provider.  If you were prescribed either an antibiotic or antifungal medicine, finish it all even if you start to feel better. SEEK IMMEDIATE MEDICAL CARE IF:  You have  increasing pain or severe headaches.  You have nausea, vomiting, or drowsiness.  You have swelling around your face.  You have vision problems.  You have a stiff neck.  You have difficulty breathing.   This information is not intended to replace advice given to you by your health care provider. Make sure you discuss any questions you have with your health care provider.   Document Released: 03/13/2005 Document Revised: 04/03/2014 Document Reviewed: 03/28/2011 Elsevier Interactive Patient Education 2016 [place] Maintenance, Female Adopting a healthy lifestyle and getting preventive care can go a long way to promote health and wellness. Talk with your health care provider about what schedule of regular examinations is right for you. This is a good chance for you to check in with your provider about disease prevention and staying healthy. In between checkups, there are plenty of things you can do on your own. Experts have done a lot of research about which lifestyle changes and preventive measures are most likely to keep you healthy. Ask your health care provider for more information. WEIGHT AND DIET  Eat a healthy diet  Be sure to include plenty of vegetables, fruits, low-fat dairy products, and lean protein.  Do not eat a lot of foods high in solid fats, added sugars, or salt.  Get regular exercise. This is one of the most important things you can do for your health.  Most adults should exercise for at least 150 minutes each week. The exercise should increase your heart rate and make you sweat (moderate-intensity exercise).  Most adults should also do strengthening exercises at least twice a week. This is in addition to the moderate-intensity exercise.  Maintain a healthy weight  Body mass index (BMI) is a measurement that can be used to identify possible weight problems. It estimates body fat based on height and weight. Your health care provider can help determine your  BMI and help you achieve or maintain a healthy weight.  For females 46 years of age and older:   A BMI below 18.5 is considered underweight.  A BMI of 18.5 to 24.9 is normal.  A BMI of 25 to 29.9 is considered overweight.  A BMI of 30 and above is considered obese.  Watch levels of cholesterol and blood lipids  You should start having your blood tested for lipids and cholesterol at 46 years of age, then have this test every 5 years.  You may need to have your cholesterol levels checked more often if:  Your lipid or cholesterol levels are high.  You are older than 46 years of age.  You are at high risk for heart disease.  CANCER SCREENING   Lung Cancer  Lung cancer screening is recommended for adults 56-44 years old who are at high risk for lung cancer because of a history of smoking.  A yearly low-dose CT scan of the lungs is recommended for people who:  Currently smoke.  Have quit within the past 15 years.  Have at least a 30-pack-year history of smoking. A pack year is smoking an average of one pack of cigarettes a day for 1 year.  Yearly screening should continue until it has been 15 years since you quit.  Yearly screening  should stop if you develop a health problem that would prevent you from having lung cancer treatment.  Breast Cancer  Practice breast self-awareness. This means understanding how your breasts normally appear and feel.  It also means doing regular breast self-exams. Let your health care provider know about any changes, no matter how small.  If you are in your 20s or 30s, you should have a clinical breast exam (CBE) by a health care provider every 1-3 years as part of a regular health exam.  If you are 82 or older, have a CBE every year. Also consider having a breast X-ray (mammogram) every year.  If you have a family history of breast cancer, talk to your health care provider about genetic screening.  If you are at high risk for breast  cancer, talk to your health care provider about having an MRI and a mammogram every year.  Breast cancer gene (BRCA) assessment is recommended for women who have family members with BRCA-related cancers. BRCA-related cancers include:  Breast.  Ovarian.  Tubal.  Peritoneal cancers.  Results of the assessment will determine the need for genetic counseling and BRCA1 and BRCA2 testing. Cervical Cancer Your health care provider may recommend that you be screened regularly for cancer of the pelvic organs (ovaries, uterus, and vagina). This screening involves a pelvic examination, including checking for microscopic changes to the surface of your cervix (Pap test). You may be encouraged to have this screening done every 3 years, beginning at age 36.  For women ages 43-65, health care providers may recommend pelvic exams and Pap testing every 3 years, or they may recommend the Pap and pelvic exam, combined with testing for human papilloma virus (HPV), every 5 years. Some types of HPV increase your risk of cervical cancer. Testing for HPV may also be done on women of any age with unclear Pap test results.  Other health care providers may not recommend any screening for nonpregnant women who are considered low risk for pelvic cancer and who do not have symptoms. Ask your health care provider if a screening pelvic exam is right for you.  If you have had past treatment for cervical cancer or a condition that could lead to cancer, you need Pap tests and screening for cancer for at least 20 years after your treatment. If Pap tests have been discontinued, your risk factors (such as having a new sexual partner) need to be reassessed to determine if screening should resume. Some women have medical problems that increase the chance of getting cervical cancer. In these cases, your health care provider may recommend more frequent screening and Pap tests. Colorectal Cancer  This type of cancer can be detected and  often prevented.  Routine colorectal cancer screening usually begins at 46 years of age and continues through 45 years of age.  Your health care provider may recommend screening at an earlier age if you have risk factors for colon cancer.  Your health care provider may also recommend using home test kits to check for hidden blood in the stool.  A small camera at the end of a tube can be used to examine your colon directly (sigmoidoscopy or colonoscopy). This is done to check for the earliest forms of colorectal cancer.  Routine screening usually begins at age 87.  Direct examination of the colon should be repeated every 5-10 years through 46 years of age. However, you may need to be screened more often if early forms of precancerous polyps or small growths  are found. Skin Cancer  Check your skin from head to toe regularly.  Tell your health care provider about any new moles or changes in moles, especially if there is a change in a mole's shape or color.  Also tell your health care provider if you have a mole that is larger than the size of a pencil eraser.  Always use sunscreen. Apply sunscreen liberally and repeatedly throughout the day.  Protect yourself by wearing long sleeves, pants, a wide-brimmed hat, and sunglasses whenever you are outside. HEART DISEASE, DIABETES, AND HIGH BLOOD PRESSURE   High blood pressure causes heart disease and increases the risk of stroke. High blood pressure is more likely to develop in:  People who have blood pressure in the high end of the normal range (130-139/85-89 mm Hg).  People who are overweight or obese.  People who are African American.  If you are 44-110 years of age, have your blood pressure checked every 3-5 years. If you are 51 years of age or older, have your blood pressure checked every year. You should have your blood pressure measured twice--once when you are at a hospital or clinic, and once when you are not at a hospital or clinic.  Record the average of the two measurements. To check your blood pressure when you are not at a hospital or clinic, you can use:  An automated blood pressure machine at a pharmacy.  A home blood pressure monitor.  If you are between 58 years and 91 years old, ask your health care provider if you should take aspirin to prevent strokes.  Have regular diabetes screenings. This involves taking a blood sample to check your fasting blood sugar level.  If you are at a normal weight and have a low risk for diabetes, have this test once every three years after 46 years of age.  If you are overweight and have a high risk for diabetes, consider being tested at a younger age or more often. PREVENTING INFECTION  Hepatitis B  If you have a higher risk for hepatitis B, you should be screened for this virus. You are considered at high risk for hepatitis B if:  You were born in a country where hepatitis B is common. Ask your health care provider which countries are considered high risk.  Your parents were born in a high-risk country, and you have not been immunized against hepatitis B (hepatitis B vaccine).  You have HIV or AIDS.  You use needles to inject street drugs.  You live with someone who has hepatitis B.  You have had sex with someone who has hepatitis B.  You get hemodialysis treatment.  You take certain medicines for conditions, including cancer, organ transplantation, and autoimmune conditions. Hepatitis C  Blood testing is recommended for:  Everyone born from [birth year] through 1967.  Anyone with known risk factors for hepatitis C. Sexually transmitted infections (STIs)  You should be screened for sexually transmitted infections (STIs) including gonorrhea and chlamydia if:  You are sexually active and are younger than 45 years of age.  You are older than 46 years of age and your health care provider tells you that you are at risk for this type of infection.  Your sexual activity  has changed since you were last screened and you are at an increased risk for chlamydia or gonorrhea. Ask your health care provider if you are at risk.  If you do not have HIV, but are at risk, it may be recommended that  you take a prescription medicine daily to prevent HIV infection. This is called pre-exposure prophylaxis (PrEP). You are considered at risk if:  You are sexually active and do not regularly use condoms or know the HIV status of your partner(s).  You take drugs by injection.  You are sexually active with a partner who has HIV. Talk with your health care provider about whether you are at high risk of being infected with HIV. If you choose to begin PrEP, you should first be tested for HIV. You should then be tested every 3 months for as long as you are taking PrEP.  PREGNANCY   If you are premenopausal and you may become pregnant, ask your health care provider about preconception counseling.  If you may become pregnant, take 400 to 800 micrograms (mcg) of folic acid every day.  If you want to prevent pregnancy, talk to your health care provider about birth control (contraception). OSTEOPOROSIS AND MENOPAUSE   Osteoporosis is a disease in which the bones lose minerals and strength with aging. This can result in serious bone fractures. Your risk for osteoporosis can be identified using a bone density scan.  If you are 37 years of age or older, or if you are at risk for osteoporosis and fractures, ask your health care provider if you should be screened.  Ask your health care provider whether you should take a calcium or vitamin D supplement to lower your risk for osteoporosis.  Menopause may have certain physical symptoms and risks.  Hormone replacement therapy may reduce some of these symptoms and risks. Talk to your health care provider about whether hormone replacement therapy is right for you.  HOME CARE INSTRUCTIONS   Schedule regular health, dental, and eye  exams.  Stay current with your immunizations.   Do not use any tobacco products including cigarettes, chewing tobacco, or electronic cigarettes.  If you are pregnant, do not drink alcohol.  If you are breastfeeding, limit how much and how often you drink alcohol.  Limit alcohol intake to no more than 1 drink per day for nonpregnant women. One drink equals 12 ounces of beer, 5 ounces of wine, or 1 ounces of hard liquor.  Do not use street drugs.  Do not share needles.  Ask your health care provider for help if you need support or information about quitting drugs.  Tell your health care provider if you often feel depressed.  Tell your health care provider if you have ever been abused or do not feel safe at home.   This information is not intended to replace advice given to you by your health care provider. Make sure you discuss any questions you have with your health care provider.   Document Released: 09/26/2010 Document Revised: 04/03/2014 Document Reviewed: 02/12/2013 Elsevier Interactive Patient Education [employer].

## 2015-03-18 NOTE — Progress Notes (Signed)
Subjective:    Patient ID: Brenda Walker, female    DOB: March 06, 1971, 44 y.o.   MRN: [id number]  Pt presents to the office today for chronic follow up and sinus issues.  Sinus Problem This is a new problem. The current episode started 1 to 4 weeks ago. The problem has been waxing and waning since onset. There has been no fever. Associated symptoms include congestion, ear pain, headaches, a hoarse voice and sinus pressure. Pertinent negatives include no coughing, shortness of breath, sneezing or sore throat. Past treatments include oral decongestants (went to urgent care and recieved augmentin but states it gave her diarrhea so she stopped after 1 day). The treatment provided mild relief.  Anxiety Presents for follow-up visit. Onset was 1 to 5 years ago. The problem has been waxing and waning. Symptoms include excessive worry and nervous/anxious behavior. Patient reports no depressed mood, dizziness, insomnia, irritability, palpitations, panic, restlessness or shortness of breath. Symptoms occur occasionally. The severity of symptoms is moderate. The symptoms are aggravated by family issues. The quality of sleep is good.   Her past medical history is significant for anxiety/panic attacks. There is no history of depression. Past treatments include SSRIs. The treatment provided moderate relief. Compliance with prior treatments has been good.      Review of Systems  Constitutional: Negative.  Negative for irritability.  HENT: Positive for congestion, ear pain, hoarse voice and sinus pressure. Negative for sneezing and sore throat.   Eyes: Negative.   Respiratory: Negative.  Negative for cough and shortness of breath.   Cardiovascular: Negative.  Negative for palpitations.  Gastrointestinal: Negative.   Endocrine: Negative.   Genitourinary: Negative.   Musculoskeletal: Negative.   Neurological: Positive for headaches. Negative for dizziness.  Hematological: Negative.     Psychiatric/Behavioral: The patient is nervous/anxious. The patient does not have insomnia.   All other systems reviewed and are negative.      Objective:   Physical Exam  Constitutional: She is oriented to person, place, and time. She appears well-developed and well-nourished. No distress.  HENT:  Head: Normocephalic and atraumatic.  Right Ear: External ear normal.  Left Ear: External ear normal.  Nose: Right sinus exhibits maxillary sinus tenderness. Left sinus exhibits maxillary sinus tenderness.  Mouth/Throat: Oropharynx is clear and moist.  Nasal passage erythemas with mild swelling    Eyes: Pupils are equal, round, and reactive to light.  Neck: Normal range of motion. Neck supple. No thyromegaly present.  Cardiovascular: Normal rate, regular rhythm, normal heart sounds and intact distal pulses.   No murmur heard. Pulmonary/Chest: Effort normal and breath sounds normal. No respiratory distress. She has no wheezes.  Abdominal: Soft. Bowel sounds are normal. She exhibits no distension. There is no tenderness.  Musculoskeletal: Normal range of motion. She exhibits no edema or tenderness.  Neurological: She is alert and oriented to person, place, and time. She has normal reflexes. No cranial nerve deficit.  Skin: Skin is warm and dry.  Psychiatric: She has a normal mood and affect. Her behavior is normal. Judgment and thought content normal.  Vitals reviewed.     BP 124/81 mmHg  Pulse 74  Temp(Src) 97.3 F (36.3 C) (Oral)  Ht 5\' 3"  (1.6 m)  Wt 152 lb 9.6 oz (69.219 kg)  BMI 27.04 kg/m2     Assessment & Plan:  1. Generalized anxiety disorder Stress management discussed - escitalopram (LEXAPRO) 10 MG tablet; TAKE ONE-HALF (1/2) TABLET (5MG ) BY MOUTH EVERY DAY  Dispense: 45 tablet; Refill:  3  2. Allergic rhinitis, unspecified allergic rhinitis type  3. Acute maxillary sinusitis, recurrence not specified -- Take meds as prescribed - Use a cool mist humidifier  -Use  saline nose sprays frequently -Saline irrigations of the nose can be very helpful if done frequently.  * 4X daily for 1 week*  * Use of a nettie pot can be helpful with this. Follow directions with this* -Force fluids -For any cough or congestion  Use plain Mucinex- regular strength or max strength is fine   * Children- consult with Pharmacist for dosing -For fever or aces or pains- take tylenol or ibuprofen appropriate for age and weight.  * for fevers greater than 101 orally you may alternate ibuprofen and tylenol every  3 hours. -Throat lozenges if helps - azithromycin (ZITHROMAX Z-PAK) 250 MG tablet; As directed  Dispense: 1 each; Refill: 0   Continue all meds Labs pending Health Maintenance reviewed- Pt has mammogram scheduled of 03/24/15 Diet and exercise encouraged RTO 6 months  Evelina Dun, FNP

## 2015-03-24 ENCOUNTER — Ambulatory Visit
Admission: RE | Admit: 2015-03-24 | Discharge: 2015-03-24 | Disposition: A | Discharge status: Home / Self Care | Payer: Managed Care, Other (non HMO) | Source: Ambulatory Visit

## 2015-03-24 DIAGNOSIS — Z1231 Encounter for screening mammogram for malignant neoplasm of breast: Secondary | ICD-10-CM

## 2015-05-21 ENCOUNTER — Other Ambulatory Visit: Payer: Self-pay

## 2015-05-21 MED ORDER — NORGESTIM-ETH ESTRAD TRIPHASIC 0.18/0.215/0.25 MG-35 MCG PO TABS
1.0000 | ORAL_TABLET | Freq: Every day | ORAL | Status: DC
Start: 2015-05-21 — End: 2015-07-06

## 2015-06-21 ENCOUNTER — Other Ambulatory Visit: Payer: Managed Care, Other (non HMO) | Admitting: Family

## 2015-06-22 ENCOUNTER — Other Ambulatory Visit: Payer: Self-pay | Admitting: *Deleted

## 2015-06-22 DIAGNOSIS — R232 Flushing: Secondary | ICD-10-CM

## 2015-06-22 DIAGNOSIS — G44009 Cluster headache syndrome, unspecified, not intractable: Secondary | ICD-10-CM

## 2015-06-22 MED ORDER — ESTRADIOL 0.025 MG/24HR TD PTWK: MEDICATED_PATCH | TRANSDERMAL | Status: DC

## 2015-07-06 ENCOUNTER — Encounter: Payer: Self-pay | Admitting: *Deleted

## 2015-07-06 ENCOUNTER — Ambulatory Visit (INDEPENDENT_AMBULATORY_CARE_PROVIDER_SITE_OTHER): Payer: Managed Care, Other (non HMO) | Admitting: Family

## 2015-07-06 ENCOUNTER — Encounter (INDEPENDENT_AMBULATORY_CARE_PROVIDER_SITE_OTHER): Payer: Self-pay

## 2015-07-06 ENCOUNTER — Encounter: Payer: Self-pay | Admitting: Family

## 2015-07-06 VITALS — BP 109/76 | HR 76 | Temp 97.6°F | Ht 63.0 in | Wt 153.0 lb

## 2015-07-06 DIAGNOSIS — R519 Headache, unspecified: Secondary | ICD-10-CM

## 2015-07-06 DIAGNOSIS — Z23 Encounter for immunization: Secondary | ICD-10-CM

## 2015-07-06 DIAGNOSIS — Z01419 Encounter for gynecological examination (general) (routine) without abnormal findings: Secondary | ICD-10-CM | POA: Diagnosis not present

## 2015-07-06 DIAGNOSIS — J309 Allergic rhinitis, unspecified: Secondary | ICD-10-CM

## 2015-07-06 DIAGNOSIS — Z0000 Encounter for general adult medical examination without abnormal findings: Secondary | ICD-10-CM

## 2015-07-06 DIAGNOSIS — G44009 Cluster headache syndrome, unspecified, not intractable: Secondary | ICD-10-CM

## 2015-07-06 DIAGNOSIS — F411 Generalized anxiety disorder: Secondary | ICD-10-CM

## 2015-07-06 DIAGNOSIS — R51 Headache: Secondary | ICD-10-CM

## 2015-07-06 DIAGNOSIS — R232 Flushing: Secondary | ICD-10-CM

## 2015-07-06 MED ORDER — NORGESTIM-ETH ESTRAD TRIPHASIC 0.18/0.215/0.25 MG-35 MCG PO TABS
1.0000 | ORAL_TABLET | Freq: Every day | ORAL | Status: DC
Start: 2015-07-06 — End: 2015-09-08

## 2015-07-06 MED ORDER — FLUTICASONE PROPIONATE 50 MCG/ACT NA SUSP: 2.0000 | Freq: Every day | NASAL | Status: DC

## 2015-07-06 MED ORDER — ESCITALOPRAM OXALATE 10 MG PO TABS: ORAL_TABLET | ORAL | Status: DC

## 2015-07-06 MED ORDER — ESTRADIOL 0.025 MG/24HR TD PTWK: MEDICATED_PATCH | TRANSDERMAL | Status: DC

## 2015-07-06 NOTE — Progress Notes (Signed)
Subjective:    Patient ID: Brenda Walker, female    DOB: April 27, 1970, 45 y.o.   MRN: [id number]  Patient is here today for CPE with pap. Patient reports doing well, no acute complaint today.  Gynecologic Exam  Anxiety Presents for follow-up visit. Onset was at an unknown time. The problem has been resolved. Symptoms include excessive worry. Patient reports no depressed mood, nervous/anxious behavior, panic or shortness of breath. Symptoms occur rarely. The severity of symptoms is mild. The quality of sleep is good.   Her past medical history is significant for anxiety/panic attacks. Past treatments include SSRIs. The treatment provided significant relief. Compliance with prior treatments has been good.   Headache:    Have the headache sporadically and is using trisprintec along with climara patch to help decrease headache incidence.    Review of Systems  Constitutional: Negative.   HENT: Negative.   Eyes: Negative.   Respiratory: Negative.  Negative for shortness of breath.   Cardiovascular: Negative.   Gastrointestinal: Negative.   Endocrine: Negative.   Genitourinary: Negative.   Musculoskeletal: Negative.   Skin: Negative.   Allergic/Immunologic: Negative.   Neurological: Negative.   Hematological: Negative.   Psychiatric/Behavioral: Negative.  The patient is not nervous/anxious.        Objective:   Physical Exam  Constitutional: She is oriented to person, place, and time. She appears well-developed and well-nourished.  HENT:  Head: Normocephalic.  Right Ear: Hearing, tympanic membrane, external ear and ear canal normal.  Left Ear: Hearing, tympanic membrane, external ear and ear canal normal.  Nose: Nose normal.  Mouth/Throat: Uvula is midline and oropharynx is clear and moist.  Eyes: Conjunctivae and EOM are normal. Pupils are equal, round, and reactive to light.  Neck: Normal range of motion and full passive range of motion without pain. Neck supple. No  JVD present. Carotid bruit is not present. No thyroid mass and no thyromegaly present.  Cardiovascular: Normal rate, normal heart sounds and intact distal pulses.   No murmur heard. Pulmonary/Chest: Effort normal and breath sounds normal. Right breast exhibits no inverted nipple, no mass, no nipple discharge, no skin change and no tenderness. Left breast exhibits no inverted nipple, no mass, no nipple discharge, no skin change and no tenderness. Breasts are symmetrical.  Abdominal: Soft. Bowel sounds are normal. She exhibits no mass. There is no tenderness.  Genitourinary: Vagina normal and uterus normal. No breast swelling, tenderness, discharge or bleeding.  bimanual exam-No adnexal masses or tenderness. Cervix parous and pink no discharge non thrombosed external hemorrhoids  Musculoskeletal: Normal range of motion.  Lymphadenopathy:    She has no cervical adenopathy.  Neurological: She is alert and oriented to person, place, and time.  Skin: Skin is warm and dry.  Psychiatric: She has a normal mood and affect. Her behavior is normal. Judgment and thought content normal.    BP 109/76 mmHg  Pulse 76  Temp(Src) 97.6 F (36.4 C) (Oral)  Ht _0  (1.6 m)  Wt 153 lb (69.4 kg)  BMI 27.11 kg/m2       Assessment & Plan:  1. Allergic rhinitis, unspecified allergic rhinitis type - CMP14+EGFR - fluticasone (FLONASE) 50 MCG/ACT nasal spray; Place 2 sprays into both nostrils daily.  Dispense: 16 g; Refill: 2  2. Generalized anxiety disorder - CMP14+EGFR - escitalopram (LEXAPRO) 10 MG tablet; TAKE ONE-HALF (1/2) TABLET (5MG) BY MOUTH EVERY DAY  Dispense: 45 tablet; Refill: 3  3. Annual physical exam - Anemia Profile B - CMP14+EGFR -  Lipid panel - Thyroid Panel With TSH - VITAMIN D 25 Hydroxy (Vit-D Deficiency, Fractures) - Pap IG w/ reflex to HPV when ASC-U  4. Encounter for routine gynecological examination - CMP14+EGFR - Pap IG w/ reflex to HPV when ASC-U  5. Nonintractable  headache, unspecified chronicity pattern, unspecified headache type  - estradiol (CLIMARA - DOSED IN MG/24 HR) 0.025 mg/24hr patch; Apply one patch (0.047m) to skin during week of menstrual cycle, then remove.  Dispense: 4 patch; Refill: 0 - Norgestimate-Ethinyl Estradiol Triphasic (TRI-SPRINTEC) 0.18/0.215/0.25 MG-35 MCG tablet; Take 1 tablet by mouth daily.  Dispense: 1 Package; Refill: 3  6. Vasomotor headache - estradiol (CLIMARA - DOSED IN MG/24 HR) 0.025 mg/24hr patch; Apply one patch (0.0257m to skin during week of menstrual cycle, then remove.  Dispense: 4 patch; Refill: 0 - Norgestimate-Ethinyl Estradiol Triphasic (TRI-SPRINTEC) 0.18/0.215/0.25 MG-35 MCG tablet; Take 1 tablet by mouth daily.  Dispense: 1 Package; Refill: 3  7. Vasomotor flushing - estradiol (CLIMARA - DOSED IN MG/24 HR) 0.025 mg/24hr patch; Apply one patch (0.02558mto skin during week of menstrual cycle, then remove.  Dispense: 4 patch; Refill: 0   Continue all meds Labs pending Health Maintenance reviewed- TDAP given today Diet and exercise encouraged RTO 1 year   ChrEvelina DunNP

## 2015-07-06 NOTE — Patient Instructions (Signed)
Health Maintenance, Female Adopting a healthy lifestyle and getting preventive care can go a long way to promote health and wellness. Talk with your health care provider about what schedule of regular examinations is right for you. This is a good chance for you to check in with your provider about disease prevention and staying healthy. In between checkups, there are plenty of things you can do on your own. Experts have done a lot of research about which lifestyle changes and preventive measures are most likely to keep you healthy. Ask your health care provider for more information. WEIGHT AND DIET  Eat a healthy diet  Be sure to include plenty of vegetables, fruits, low-fat dairy products, and lean protein.  Do not eat a lot of foods high in solid fats, added sugars, or salt.  Get regular exercise. This is one of the most important things you can do for your health.  Most adults should exercise for at least 150 minutes each week. The exercise should increase your heart rate and make you sweat (moderate-intensity exercise).  Most adults should also do strengthening exercises at least twice a week. This is in addition to the moderate-intensity exercise.  Maintain a healthy weight  Body mass index (BMI) is a measurement that can be used to identify possible weight problems. It estimates body fat based on height and weight. Your health care provider can help determine your BMI and help you achieve or maintain a healthy weight.  For females 33 years of age and older:   A BMI below 18.5 is considered underweight.  A BMI of 18.5 to 24.9 is normal.  A BMI of 25 to 29.9 is considered overweight.  A BMI of 30 and above is considered obese.  Watch levels of cholesterol and blood lipids  You should start having your blood tested for lipids and cholesterol at 47 years of age, then have this test every 5 years.  You may need to have your cholesterol levels checked more often if:  Your lipid  or cholesterol levels are high.  You are older than 47 years of age.  You are at high risk for heart disease.  CANCER SCREENING   Lung Cancer  Lung cancer screening is recommended for adults 31-66 years old who are at high risk for lung cancer because of a history of smoking.  A yearly low-dose CT scan of the lungs is recommended for people who:  Currently smoke.  Have quit within the past 15 years.  Have at least a 30-pack-year history of smoking. A pack year is smoking an average of one pack of cigarettes a day for 1 year.  Yearly screening should continue until it has been 15 years since you quit.  Yearly screening should stop if you develop a health problem that would prevent you from having lung cancer treatment.  Breast Cancer  Practice breast self-awareness. This means understanding how your breasts normally appear and feel.  It also means doing regular breast self-exams. Let your health care provider know about any changes, no matter how small.  If you are in your 20s or 30s, you should have a clinical breast exam (CBE) by a health care provider every 1-3 years as part of a regular health exam.  If you are 10 or older, have a CBE every year. Also consider having a breast X-ray (mammogram) every year.  If you have a family history of breast cancer, talk to your health care provider about genetic screening.  If you  are at high risk for breast cancer, talk to your health care provider about having an MRI and a mammogram every year.  Breast cancer gene (BRCA) assessment is recommended for women who have family members with BRCA-related cancers. BRCA-related cancers include:  Breast.  Ovarian.  Tubal.  Peritoneal cancers.  Results of the assessment will determine the need for genetic counseling and BRCA1 and BRCA2 testing. Cervical Cancer Your health care provider may recommend that you be screened regularly for cancer of the pelvic organs (ovaries, uterus, and  vagina). This screening involves a pelvic examination, including checking for microscopic changes to the surface of your cervix (Pap test). You may be encouraged to have this screening done every 3 years, beginning at age 99.  For women ages 30-65, health care providers may recommend pelvic exams and Pap testing every 3 years, or they may recommend the Pap and pelvic exam, combined with testing for human papilloma virus (HPV), every 5 years. Some types of HPV increase your risk of cervical cancer. Testing for HPV may also be done on women of any age with unclear Pap test results.  Other health care providers may not recommend any screening for nonpregnant women who are considered low risk for pelvic cancer and who do not have symptoms. Ask your health care provider if a screening pelvic exam is right for you.  If you have had past treatment for cervical cancer or a condition that could lead to cancer, you need Pap tests and screening for cancer for at least 20 years after your treatment. If Pap tests have been discontinued, your risk factors (such as having a new sexual partner) need to be reassessed to determine if screening should resume. Some women have medical problems that increase the chance of getting cervical cancer. In these cases, your health care provider may recommend more frequent screening and Pap tests. Colorectal Cancer  This type of cancer can be detected and often prevented.  Routine colorectal cancer screening usually begins at 47 years of age and continues through 47 years of age.  Your health care provider may recommend screening at an earlier age if you have risk factors for colon cancer.  Your health care provider may also recommend using home test kits to check for hidden blood in the stool.  A small camera at the end of a tube can be used to examine your colon directly (sigmoidoscopy or colonoscopy). This is done to check for the earliest forms of colorectal  cancer.  Routine screening usually begins at age 14.  Direct examination of the colon should be repeated every 5-10 years through 47 years of age. However, you may need to be screened more often if early forms of precancerous polyps or small growths are found. Skin Cancer  Check your skin from head to toe regularly.  Tell your health care provider about any new moles or changes in moles, especially if there is a change in a mole's shape or color.  Also tell your health care provider if you have a mole that is larger than the size of a pencil eraser.  Always use sunscreen. Apply sunscreen liberally and repeatedly throughout the day.  Protect yourself by wearing long sleeves, pants, a wide-brimmed hat, and sunglasses whenever you are outside. HEART DISEASE, DIABETES, AND HIGH BLOOD PRESSURE   High blood pressure causes heart disease and increases the risk of stroke. High blood pressure is more likely to develop in:  People who have blood pressure in the high end  of the normal range (130-139/85-89 mm Hg).  People who are overweight or obese.  People who are African American.  If you are 101-72 years of age, have your blood pressure checked every 3-5 years. If you are 32 years of age or older, have your blood pressure checked every year. You should have your blood pressure measured twice--once when you are at a hospital or clinic, and once when you are not at a hospital or clinic. Record the average of the two measurements. To check your blood pressure when you are not at a hospital or clinic, you can use:  An automated blood pressure machine at a pharmacy.  A home blood pressure monitor.  If you are between 64 years and 52 years old, ask your health care provider if you should take aspirin to prevent strokes.  Have regular diabetes screenings. This involves taking a blood sample to check your fasting blood sugar level.  If you are at a normal weight and have a low risk for diabetes,  have this test once every three years after 47 years of age.  If you are overweight and have a high risk for diabetes, consider being tested at a younger age or more often. PREVENTING INFECTION  Hepatitis B  If you have a higher risk for hepatitis B, you should be screened for this virus. You are considered at high risk for hepatitis B if:  You were born in a country where hepatitis B is common. Ask your health care provider which countries are considered high risk.  Your parents were born in a high-risk country, and you have not been immunized against hepatitis B (hepatitis B vaccine).  You have HIV or AIDS.  You use needles to inject street drugs.  You live with someone who has hepatitis B.  You have had sex with someone who has hepatitis B.  You get hemodialysis treatment.  You take certain medicines for conditions, including cancer, organ transplantation, and autoimmune conditions. Hepatitis C  Blood testing is recommended for:  Everyone born from [birth year] through 1967.  Anyone with known risk factors for hepatitis C. Sexually transmitted infections (STIs)  You should be screened for sexually transmitted infections (STIs) including gonorrhea and chlamydia if:  You are sexually active and are younger than 47 years of age.  You are older than 47 years of age and your health care provider tells you that you are at risk for this type of infection.  Your sexual activity has changed since you were last screened and you are at an increased risk for chlamydia or gonorrhea. Ask your health care provider if you are at risk.  If you do not have HIV, but are at risk, it may be recommended that you take a prescription medicine daily to prevent HIV infection. This is called pre-exposure prophylaxis (PrEP). You are considered at risk if:  You are sexually active and do not regularly use condoms or know the HIV status of your partner(s).  You take drugs by injection.  You are sexually  active with a partner who has HIV. Talk with your health care provider about whether you are at high risk of being infected with HIV. If you choose to begin PrEP, you should first be tested for HIV. You should then be tested every 3 months for as long as you are taking PrEP.  PREGNANCY   If you are premenopausal and you may become pregnant, ask your health care provider about preconception counseling.  If you may  become pregnant, take 400 to 800 micrograms (mcg) of folic acid every day.  If you want to prevent pregnancy, talk to your health care provider about birth control (contraception). OSTEOPOROSIS AND MENOPAUSE   Osteoporosis is a disease in which the bones lose minerals and strength with aging. This can result in serious bone fractures. Your risk for osteoporosis can be identified using a bone density scan.  If you are 61 years of age or older, or if you are at risk for osteoporosis and fractures, ask your health care provider if you should be screened.  Ask your health care provider whether you should take a calcium or vitamin D supplement to lower your risk for osteoporosis.  Menopause may have certain physical symptoms and risks.  Hormone replacement therapy may reduce some of these symptoms and risks. Talk to your health care provider about whether hormone replacement therapy is right for you.  HOME CARE INSTRUCTIONS   Schedule regular health, dental, and eye exams.  Stay current with your immunizations.   Do not use any tobacco products including cigarettes, chewing tobacco, or electronic cigarettes.  If you are pregnant, do not drink alcohol.  If you are breastfeeding, limit how much and how often you drink alcohol.  Limit alcohol intake to no more than 1 drink per day for nonpregnant women. One drink equals 12 ounces of beer, 5 ounces of wine, or 1 ounces of hard liquor.  Do not use street drugs.  Do not share needles.  Ask your health care provider for help if  you need support or information about quitting drugs.  Tell your health care provider if you often feel depressed.  Tell your health care provider if you have ever been abused or do not feel safe at home.   This information is not intended to replace advice given to you by your health care provider. Make sure you discuss any questions you have with your health care provider.   Document Released: 09/26/2010 Document Revised: 04/03/2014 Document Reviewed: 02/12/2013 Elsevier Interactive Patient Education [employer].

## 2015-07-07 LAB — THYROID PANEL WITH TSH
Free Thyroxine Index: 2.2 (ref 1.2–4.9)
T3 Uptake Ratio: 21 % — ABNORMAL LOW (ref 24–39)
T4 TOTAL: 10.3 ug/dL (ref 4.5–12.0)
TSH: 2.63 u[IU]/mL (ref 0.450–4.500)

## 2015-07-07 LAB — ANEMIA PROFILE B
BASOS ABS: 0 10*3/uL (ref 0.0–0.2)
Basos: 0 %
EOS (ABSOLUTE): 0.1 10*3/uL (ref 0.0–0.4)
Eos: 1 %
FOLATE: 10.9 ng/mL (ref >3.0)
Ferritin: 27 ng/mL (ref 15–150)
Hematocrit: 38.2 % (ref 34.0–46.6)
Hemoglobin: 12.5 g/dL (ref 11.1–15.9)
IMMATURE GRANULOCYTES: 0 %
Immature Grans (Abs): 0 10*3/uL (ref 0.0–0.1)
Iron Saturation: 41 % (ref 15–55)
Iron: 152 ug/dL (ref 27–159)
LYMPHS ABS: 0.8 10*3/uL (ref 0.7–3.1)
Lymphs: 12 %
MCH: 30.3 pg (ref 26.6–33.0)
MCHC: 32.7 g/dL (ref 31.5–35.7)
MCV: 93 fL (ref 79–97)
Monocytes Absolute: 0.3 10*3/uL (ref 0.1–0.9)
Monocytes: 5 %
NEUTROS ABS: 4.9 10*3/uL (ref 1.4–7.0)
NEUTROS PCT: 82 %
PLATELETS: 328 10*3/uL (ref 150–379)
RBC: 4.13 x10E6/uL (ref 3.77–5.28)
RDW: 13.7 % (ref 12.3–15.4)
Retic Ct Pct: 1.1 % (ref 0.6–2.6)
TIBC: 375 ug/dL (ref 250–450)
UIBC: 223 ug/dL (ref 131–425)
VITAMIN B 12: 352 pg/mL (ref 211–946)
WBC: 6.1 10*3/uL (ref 3.4–10.8)

## 2015-07-07 LAB — CMP14+EGFR
ALK PHOS: 73 IU/L (ref 39–117)
ALT: 15 IU/L (ref 0–32)
AST: 20 IU/L (ref 0–40)
Albumin/Globulin Ratio: 1.4 (ref 1.2–2.2)
Albumin: 4.1 g/dL (ref 3.5–5.5)
BILIRUBIN TOTAL: 0.3 mg/dL (ref 0.0–1.2)
BUN / CREAT RATIO: 15 (ref 9–23)
BUN: 14 mg/dL (ref 6–24)
CHLORIDE: 99 mmol/L (ref 96–106)
CO2: 21 mmol/L (ref 18–29)
CREATININE: 0.94 mg/dL (ref 0.57–1.00)
Calcium: 9.5 mg/dL (ref 8.7–10.2)
GFR calc Af Amer: 84 mL/min/{1.73_m2} (ref >59)
GFR calc non Af Amer: 72 mL/min/{1.73_m2} (ref >59)
GLUCOSE: 80 mg/dL (ref 65–99)
Globulin, Total: 2.9 g/dL (ref 1.5–4.5)
Potassium: 4.3 mmol/L (ref 3.5–5.2)
Sodium: 137 mmol/L (ref 134–144)
Total Protein: 7 g/dL (ref 6.0–8.5)

## 2015-07-07 LAB — LIPID PANEL
CHOLESTEROL TOTAL: 188 mg/dL (ref 100–199)
Chol/HDL Ratio: 3 ratio units (ref 0.0–4.4)
HDL: 63 mg/dL (ref >39)
LDL Calculated: 100 mg/dL — ABNORMAL HIGH (ref 0–99)
TRIGLYCERIDES: 126 mg/dL (ref 0–149)
VLDL CHOLESTEROL CAL: 25 mg/dL (ref 5–40)

## 2015-07-07 LAB — VITAMIN D 25 HYDROXY (VIT D DEFICIENCY, FRACTURES): VIT D 25 HYDROXY: 32 ng/mL (ref 30.0–100.0)

## 2015-07-09 LAB — PAP IG W/ RFLX HPV ASCU: PAP SMEAR COMMENT: 0

## 2015-09-08 ENCOUNTER — Other Ambulatory Visit: Payer: Self-pay

## 2015-09-08 DIAGNOSIS — R519 Headache, unspecified: Secondary | ICD-10-CM

## 2015-09-08 DIAGNOSIS — R51 Headache: Principal | ICD-10-CM

## 2015-09-08 DIAGNOSIS — G44009 Cluster headache syndrome, unspecified, not intractable: Secondary | ICD-10-CM

## 2015-09-08 DIAGNOSIS — J309 Allergic rhinitis, unspecified: Secondary | ICD-10-CM

## 2015-09-08 MED ORDER — FLUTICASONE PROPIONATE 50 MCG/ACT NA SUSP: 2.0000 | Freq: Every day | NASAL | Status: DC

## 2015-09-08 MED ORDER — NORGESTIM-ETH ESTRAD TRIPHASIC 0.18/0.215/0.25 MG-35 MCG PO TABS: 1.0000 | ORAL_TABLET | Freq: Every day | ORAL | Status: DC

## 2015-10-26 ENCOUNTER — Encounter: Payer: Self-pay | Admitting: Nurse Practitioner

## 2015-10-26 ENCOUNTER — Ambulatory Visit (INDEPENDENT_AMBULATORY_CARE_PROVIDER_SITE_OTHER): Payer: Managed Care, Other (non HMO) | Admitting: Nurse Practitioner

## 2015-10-26 VITALS — BP 123/79 | HR 83 | Temp 97.5°F | Ht 63.0 in | Wt 152.0 lb

## 2015-10-26 DIAGNOSIS — J301 Allergic rhinitis due to pollen: Secondary | ICD-10-CM

## 2015-10-26 DIAGNOSIS — R319 Hematuria, unspecified: Secondary | ICD-10-CM | POA: Diagnosis not present

## 2015-10-26 DIAGNOSIS — N390 Urinary tract infection, site not specified: Secondary | ICD-10-CM | POA: Diagnosis not present

## 2015-10-26 LAB — URINALYSIS
BILIRUBIN UA: NEGATIVE
GLUCOSE, UA: NEGATIVE
Ketones, UA: NEGATIVE
Leukocytes, UA: NEGATIVE
Nitrite, UA: NEGATIVE
Protein, UA: NEGATIVE
SPEC GRAV UA: 1.015 (ref 1.005–1.030)
UUROB: 0.2 mg/dL (ref 0.2–1.0)
pH, UA: 6.5 (ref 5.0–7.5)

## 2015-10-26 NOTE — Progress Notes (Signed)
   Subjective:    Patient ID: Brenda Walker, female    DOB: 05/11/1970, 45 y.o.   MRN: [id number]  HPI  Patient comes in today for her son to be seen but she is c/o bil ear pain- runny nose- denies cough and congestion.  * went to [facility] weeks ago and they thought she had blood in her urine and they wanted it rechecked.  Review of Systems  Constitutional: Negative.   HENT: Positive for congestion and ear pain. Negative for sinus pressure, sore throat and trouble swallowing.   Eyes: Positive for discharge (watery) and itching.  Respiratory: Negative.   Cardiovascular: Negative.   Genitourinary: Negative.   Neurological: Negative.   Psychiatric/Behavioral: Negative.   All other systems reviewed and are negative.      Objective:   Physical Exam  Constitutional: She is oriented to person, place, and time. She appears well-developed and well-nourished. No distress.  HENT:  Right Ear: Hearing, tympanic membrane, external ear and ear canal normal.  Left Ear: Hearing, tympanic membrane, external ear and ear canal normal.  Nose: Mucosal edema and rhinorrhea present. Right sinus exhibits no maxillary sinus tenderness and no frontal sinus tenderness. Left sinus exhibits no maxillary sinus tenderness and no frontal sinus tenderness.  Mouth/Throat: Uvula is midline, oropharynx is clear and moist and mucous membranes are normal.  Cardiovascular: Normal rate, regular rhythm and normal heart sounds.   Pulmonary/Chest: Effort normal and breath sounds normal.  Neurological: She is alert and oriented to person, place, and time.  Skin: Skin is warm.  Psychiatric: She has a normal mood and affect. Her behavior is normal. Judgment and thought content normal.   BP 123/79   Pulse 83   Temp 97.5 F (36.4 C) (Oral)   Ht 5\' 3"  (1.6 m)   Wt 152 lb (68.9 kg)   BMI 26.93 kg/m   urine clear     Assessment & Plan:  1. Urinary tract infection with hematuria, site unspecified Force  fluids - Urinalysis  2. Allergic rhinitis due to pollen claritin or zyrtec OTC Continue flonase as rx  Mary-Margaret Hassell Done, FNP

## 2015-10-26 NOTE — Patient Instructions (Signed)
Allergic Rhinitis Allergic rhinitis is when the mucous membranes in the nose respond to allergens. Allergens are particles in the air that cause your body to have an allergic reaction. This causes you to release allergic antibodies. Through a chain of events, these eventually cause you to release histamine into the blood stream. Although meant to protect the body, it is this release of histamine that causes your discomfort, such as frequent sneezing, congestion, and an itchy, runny nose.  CAUSES Seasonal allergic rhinitis (hay fever) is caused by pollen allergens that may come from grasses, trees, and weeds. Year-round allergic rhinitis (perennial allergic rhinitis) is caused by allergens such as house dust mites, pet dander, and mold spores. SYMPTOMS  Nasal stuffiness (congestion).  Itchy, runny nose with sneezing and tearing of the eyes. DIAGNOSIS Your health care provider can help you determine the allergen or allergens that trigger your symptoms. If you and your health care provider are unable to determine the allergen, skin or blood testing may be used. Your health care provider will diagnose your condition after taking your health history and performing a physical exam. Your health care provider may assess you for other related conditions, such as asthma, pink eye, or an ear infection. TREATMENT Allergic rhinitis does not have a cure, but it can be controlled by:  Medicines that block allergy symptoms. These may include allergy shots, nasal sprays, and oral antihistamines.  Avoiding the allergen. Hay fever may often be treated with antihistamines in pill or nasal spray forms. Antihistamines block the effects of histamine. There are over-the-counter medicines that may help with nasal congestion and swelling around the eyes. Check with your health care provider before taking or giving this medicine. If avoiding the allergen or the medicine prescribed do not work, there are many new medicines  your health care provider can prescribe. Stronger medicine may be used if initial measures are ineffective. Desensitizing injections can be used if medicine and avoidance does not work. Desensitization is when a patient is given ongoing shots until the body becomes less sensitive to the allergen. Make sure you follow up with your health care provider if problems continue. HOME CARE INSTRUCTIONS It is not possible to completely avoid allergens, but you can reduce your symptoms by taking steps to limit your exposure to them. It helps to know exactly what you are allergic to so that you can avoid your specific triggers. SEEK MEDICAL CARE IF:  You have a fever.  You develop a cough that does not stop easily (persistent).  You have shortness of breath.  You start wheezing.  Symptoms interfere with normal daily activities.   This information is not intended to replace advice given to you by your health care provider. Make sure you discuss any questions you have with your health care provider.   Document Released: 12/06/2000 Document Revised: 04/03/2014 Document Reviewed: 11/18/2012 Elsevier Interactive Patient Education 2016 Elsevier Inc[employer]  

## 2016-03-07 ENCOUNTER — Other Ambulatory Visit: Payer: Self-pay | Admitting: Family Medicine

## 2016-03-07 ENCOUNTER — Other Ambulatory Visit: Payer: Self-pay | Admitting: Family

## 2016-03-07 DIAGNOSIS — Z803 Family history of malignant neoplasm of breast: Secondary | ICD-10-CM

## 2016-03-07 DIAGNOSIS — R519 Headache, unspecified: Secondary | ICD-10-CM

## 2016-03-07 DIAGNOSIS — R51 Headache: Principal | ICD-10-CM

## 2016-03-07 DIAGNOSIS — Z1231 Encounter for screening mammogram for malignant neoplasm of breast: Secondary | ICD-10-CM

## 2016-03-07 DIAGNOSIS — G44009 Cluster headache syndrome, unspecified, not intractable: Secondary | ICD-10-CM

## 2016-04-10 ENCOUNTER — Ambulatory Visit: Payer: Managed Care, Other (non HMO)

## 2016-04-11 ENCOUNTER — Ambulatory Visit
Admission: RE | Admit: 2016-04-11 | Discharge: 2016-04-11 | Disposition: A | Discharge status: Home / Self Care | Payer: Commercial Managed Care - PPO | Source: Ambulatory Visit | Attending: Family Medicine | Admitting: Family Medicine

## 2016-04-11 DIAGNOSIS — Z803 Family history of malignant neoplasm of breast: Secondary | ICD-10-CM

## 2016-04-11 DIAGNOSIS — Z1231 Encounter for screening mammogram for malignant neoplasm of breast: Secondary | ICD-10-CM

## 2016-04-26 ENCOUNTER — Ambulatory Visit (INDEPENDENT_AMBULATORY_CARE_PROVIDER_SITE_OTHER): Payer: Commercial Managed Care - PPO | Admitting: Physician Assistant

## 2016-04-26 ENCOUNTER — Encounter: Payer: Self-pay | Admitting: Physician Assistant

## 2016-04-26 VITALS — BP 121/86 | HR 96 | Temp 98.9°F | Ht 63.0 in | Wt 156.8 lb

## 2016-04-26 DIAGNOSIS — J324 Chronic pansinusitis: Secondary | ICD-10-CM

## 2016-04-26 MED ORDER — METHYLPREDNISOLONE ACETATE 80 MG/ML IJ SUSP
80.0000 mg | Freq: Once | INTRAMUSCULAR | Status: AC
  Administered 2016-04-26: 80 mg via INTRAMUSCULAR

## 2016-04-26 MED ORDER — LEVOFLOXACIN 500 MG PO TABS: 500.0000 mg | ORAL_TABLET | Freq: Every day | ORAL | 0 refills | Status: DC

## 2016-04-26 NOTE — Addendum Note (Signed)
Addended by: Thana Ates on: 04/26/2016 12:46 PM   Modules accepted: Orders

## 2016-04-26 NOTE — Patient Instructions (Signed)
Sinu-RINSE  Sinusitis, Adult Sinusitis is soreness and inflammation of your sinuses. Sinuses are hollow spaces in the bones around your face. They are located:  Around your eyes.  In the middle of your forehead.  Behind your nose.  In your cheekbones. Your sinuses and nasal passages are lined with a stringy fluid (mucus). Mucus normally drains out of your sinuses. When your nasal tissues get inflamed or swollen, the mucus can get trapped or blocked so air cannot flow through your sinuses. This lets bacteria, viruses, and funguses grow, and that leads to infection. Follow these instructions at home: Medicines  Take, use, or apply over-the-counter and prescription medicines only as told by your doctor. These may include nasal sprays.  If you were prescribed an antibiotic medicine, take it as told by your doctor. Do not stop taking the antibiotic even if you start to feel better. Hydrate and Humidify  Drink enough water to keep your pee (urine) clear or pale yellow.  Use a cool mist humidifier to keep the humidity level in your home above 50%.  Breathe in steam for 10-15 minutes, 3-4 times a day or as told by your doctor. You can do this in the bathroom while a hot shower is running.  Try not to spend time in cool or dry air. Rest  Rest as much as possible.  Sleep with your head raised (elevated).  Make sure to get enough sleep each night. General instructions  Put a warm, moist washcloth on your face 3-4 times a day or as told by your doctor. This will help with discomfort.  Wash your hands often with soap and water. If there is no soap and water, use hand sanitizer.  Do not smoke. Avoid being around people who are smoking (secondhand smoke).  Keep all follow-up visits as told by your doctor. This is important. Contact a doctor if:  You have a fever.  Your symptoms get worse.  Your symptoms do not get better within 10 days. Get help right away if:  You have a very  bad headache.  You cannot stop throwing up (vomiting).  You have pain or swelling around your face or eyes.  You have trouble seeing.  You feel confused.  Your neck is stiff.  You have trouble breathing. This information is not intended to replace advice given to you by your health care provider. Make sure you discuss any questions you have with your health care provider. Document Released: 08/30/2007 Document Revised: 11/07/2015 Document Reviewed: 01/06/2015 Elsevier Interactive Patient Education  2017 [employer].

## 2016-04-26 NOTE — Progress Notes (Signed)
BP 121/86   Pulse 96   Temp 98.9 F (37.2 C) (Oral)   Ht 5\' 3"  (1.6 m)   Wt 156 lb 12.8 oz (71.1 kg)   BMI 27.78 kg/m    Subjective:    Patient ID: Brenda Walker, female    DOB: 06/27/1970, 46 y.o.   MRN: [id number]  HPI: Brenda Walker is a 46 y.o. female presenting on 04/26/2016 for Cough; Sore Throat; and Headache  This patient has had many days of sinus headache and postnasal drainage. There is copious drainage at times. Denies any fever at this time. There has been a history of sinus infections in the past.  No history of sinus surgery. There is cough at night. It has become more prevalent in recent days.  She had an episode of sinusitis approximately 4-6 weeks ago. And had treatment with Omnicef that finished just 10 days ago. She has had a relapse in all of her symptoms. She stated she had significant reflux related to the [facility].  Relevant past medical, surgical, family and social history reviewed and updated as indicated. Allergies and medications reviewed and updated.  Past Medical History:  Diagnosis Date  . Anxiety   . [id number])     Past Surgical History:  Procedure Laterality Date  . NO PAST SURGERIES      Review of Systems  Constitutional: Positive for chills and fatigue. Negative for activity change and appetite change.  HENT: Positive for congestion, postnasal drip and sore throat.   Eyes: Negative.   Respiratory: Positive for cough and wheezing.   Cardiovascular: Negative.  Negative for chest pain, palpitations and leg swelling.  Gastrointestinal: Negative.   Genitourinary: Negative.   Musculoskeletal: Negative.   Skin: Negative.   Neurological: Positive for headaches.    Allergies as of 04/26/2016      Reactions   Amoxicillin-pot Clavulanate    Nausea , vomitting and diarrhea      Medication List       Accurate as of 04/26/16 11:09 AM. Always use your most recent med list.          escitalopram 10 MG tablet Commonly known  as:  LEXAPRO TAKE ONE-HALF (1/2) TABLET (5MG ) BY MOUTH EVERY DAY   fluticasone 50 MCG/ACT nasal spray Commonly known as:  FLONASE Place 2 sprays into both nostrils daily.   levofloxacin 500 MG tablet Commonly known as:  LEVAQUIN Take 1 tablet (500 mg total) by mouth daily.   TRI-PREVIFEM 0.18/0.215/0.25 MG-35 MCG tablet Generic drug:  Norgestimate-Ethinyl Estradiol Triphasic TAKE 1 TABLET BY MOUTH DAILY.          Objective:    BP 121/86   Pulse 96   Temp 98.9 F (37.2 C) (Oral)   Ht 5\' 3"  (1.6 m)   Wt 156 lb 12.8 oz (71.1 kg)   BMI 27.78 kg/m   Allergies  Allergen Reactions  . Amoxicillin-Pot Clavulanate     Nausea , vomitting and diarrhea    Physical Exam  Constitutional: She is oriented to person, place, and time. She appears well-developed and well-nourished.  HENT:  Head: Normocephalic and atraumatic.  Right Ear: Tympanic membrane and external ear normal. No middle ear effusion.  Left Ear: Tympanic membrane and external ear normal.  No middle ear effusion.  Nose: Mucosal edema and rhinorrhea present. Right sinus exhibits maxillary sinus tenderness and frontal sinus tenderness. Left sinus exhibits maxillary sinus tenderness and frontal sinus tenderness.  Mouth/Throat: Uvula is midline. Posterior oropharyngeal erythema present.  Eyes:  Conjunctivae and EOM are normal. Pupils are equal, round, and reactive to light. Right eye exhibits no discharge. Left eye exhibits no discharge.  Neck: Normal range of motion.  Cardiovascular: Normal rate, regular rhythm and normal heart sounds.   Pulmonary/Chest: Effort normal and breath sounds normal. No respiratory distress. She has no wheezes.  Abdominal: Soft.  Lymphadenopathy:    She has no cervical adenopathy.  Neurological: She is alert and oriented to person, place, and time.  Skin: Skin is warm and dry.  Psychiatric: She has a normal mood and affect.        Assessment & Plan:   1. Chronic pansinusitis -  levofloxacin (LEVAQUIN) 500 MG tablet; Take 1 tablet (500 mg total) by mouth daily.  Dispense: 10 tablet; Refill: 0   Continue all other maintenance medications as listed above.  Follow up plan: Return if symptoms worsen or fail to improve.  Educational handout given for sinusitis  Terald Sleeper PA-C [facility] [address]  [place], [place] [address] [phone]   04/26/2016, 11:09 AM

## 2016-06-06 ENCOUNTER — Other Ambulatory Visit: Payer: Self-pay | Admitting: Family

## 2016-06-06 DIAGNOSIS — F411 Generalized anxiety disorder: Secondary | ICD-10-CM

## 2016-06-16 ENCOUNTER — Emergency Department (HOSPITAL_COMMUNITY): Payer: Commercial Managed Care - PPO

## 2016-06-16 ENCOUNTER — Emergency Department (HOSPITAL_COMMUNITY)
Admission: EM | Admit: 2016-06-16 | Discharge: 2016-06-16 | Disposition: A | Discharge status: Home / Self Care | Payer: Commercial Managed Care - PPO | Attending: Emergency Medicine | Admitting: Emergency Medicine

## 2016-06-16 ENCOUNTER — Encounter (HOSPITAL_COMMUNITY): Payer: Self-pay | Admitting: Emergency Medicine

## 2016-06-16 DIAGNOSIS — S0990XA Unspecified injury of head, initial encounter: Secondary | ICD-10-CM | POA: Diagnosis present

## 2016-06-16 DIAGNOSIS — R938 Abnormal findings on diagnostic imaging of other specified body structures: Secondary | ICD-10-CM | POA: Diagnosis not present

## 2016-06-16 DIAGNOSIS — Z79899 Other long term (current) drug therapy: Secondary | ICD-10-CM | POA: Insufficient documentation

## 2016-06-16 DIAGNOSIS — Y999 Unspecified external cause status: Secondary | ICD-10-CM | POA: Insufficient documentation

## 2016-06-16 DIAGNOSIS — Z23 Encounter for immunization: Secondary | ICD-10-CM | POA: Insufficient documentation

## 2016-06-16 DIAGNOSIS — S060X9A Concussion with loss of consciousness of unspecified duration, initial encounter: Secondary | ICD-10-CM

## 2016-06-16 DIAGNOSIS — Y939 Activity, unspecified: Secondary | ICD-10-CM | POA: Diagnosis not present

## 2016-06-16 DIAGNOSIS — S060XAA Concussion with loss of consciousness status unknown, initial encounter: Secondary | ICD-10-CM

## 2016-06-16 DIAGNOSIS — R918 Other nonspecific abnormal finding of lung field: Secondary | ICD-10-CM

## 2016-06-16 DIAGNOSIS — S060X1A Concussion with loss of consciousness of 30 minutes or less, initial encounter: Secondary | ICD-10-CM | POA: Diagnosis not present

## 2016-06-16 DIAGNOSIS — Y92410 Unspecified street and highway as the place of occurrence of the external cause: Secondary | ICD-10-CM | POA: Insufficient documentation

## 2016-06-16 HISTORY — DX: Concussion with loss of consciousness of unspecified duration, initial encounter: S06.0X9A

## 2016-06-16 HISTORY — DX: Concussion with loss of consciousness status unknown, initial encounter: S06.0XAA

## 2016-06-16 LAB — COMPREHENSIVE METABOLIC PANEL
ALK PHOS: 71 U/L (ref 38–126)
ALT: 20 U/L (ref 14–54)
AST: 26 U/L (ref 15–41)
Albumin: 3.7 g/dL (ref 3.5–5.0)
Anion gap: 16 — ABNORMAL HIGH (ref 5–15)
BILIRUBIN TOTAL: 0.7 mg/dL (ref 0.3–1.2)
BUN: 14 mg/dL (ref 6–20)
CALCIUM: 9.4 mg/dL (ref 8.9–10.3)
CO2: 19 mmol/L — ABNORMAL LOW (ref 22–32)
Chloride: 103 mmol/L (ref 101–111)
Creatinine, Ser: 1.04 mg/dL — ABNORMAL HIGH (ref 0.44–1.00)
Glucose, Bld: 178 mg/dL — ABNORMAL HIGH (ref 65–99)
Potassium: 3.2 mmol/L — ABNORMAL LOW (ref 3.5–5.1)
Sodium: 138 mmol/L (ref 135–145)
TOTAL PROTEIN: 7.2 g/dL (ref 6.5–8.1)

## 2016-06-16 LAB — CBC
HCT: 39.6 % (ref 36.0–46.0)
HEMOGLOBIN: 12.8 g/dL (ref 12.0–15.0)
MCH: 29.9 pg (ref 26.0–34.0)
MCHC: 32.3 g/dL (ref 30.0–36.0)
MCV: 92.5 fL (ref 78.0–100.0)
Platelets: 384 10*3/uL (ref 150–400)
RBC: 4.28 MIL/uL (ref 3.87–5.11)
RDW: 13.3 % (ref 11.5–15.5)
WBC: 12 10*3/uL — AB (ref 4.0–10.5)

## 2016-06-16 LAB — URINALYSIS, ROUTINE W REFLEX MICROSCOPIC
Bacteria, UA: NONE SEEN
Bilirubin Urine: NEGATIVE
GLUCOSE, UA: NEGATIVE mg/dL
KETONES UR: 5 mg/dL — AB
LEUKOCYTES UA: NEGATIVE
NITRITE: NEGATIVE
PH: 6 (ref 5.0–8.0)
Protein, ur: NEGATIVE mg/dL
Specific Gravity, Urine: 1.024 (ref 1.005–1.030)

## 2016-06-16 LAB — I-STAT CHEM 8, ED
BUN: 15 mg/dL (ref 6–20)
CALCIUM ION: 1.13 mmol/L — AB (ref 1.15–1.40)
CHLORIDE: 105 mmol/L (ref 101–111)
Creatinine, Ser: 0.9 mg/dL (ref 0.44–1.00)
GLUCOSE: 178 mg/dL — AB (ref 65–99)
HCT: 39 % (ref 36.0–46.0)
Hemoglobin: 13.3 g/dL (ref 12.0–15.0)
Potassium: 3.1 mmol/L — ABNORMAL LOW (ref 3.5–5.1)
Sodium: 138 mmol/L (ref 135–145)
TCO2: 21 mmol/L (ref 0–100)

## 2016-06-16 LAB — I-STAT CG4 LACTIC ACID, ED
Lactic Acid, Venous: 3.55 mmol/L (ref 0.5–1.9)
Lactic Acid, Venous: 1.93 mmol/L (ref 0.5–1.9)

## 2016-06-16 LAB — PROTIME-INR
INR: 0.94
PROTHROMBIN TIME: 12.6 s (ref 11.4–15.2)

## 2016-06-16 LAB — SAMPLE TO BLOOD BANK

## 2016-06-16 LAB — CDS SEROLOGY

## 2016-06-16 LAB — I-STAT BETA HCG BLOOD, ED (MC, WL, AP ONLY)

## 2016-06-16 LAB — ETHANOL

## 2016-06-16 MED ORDER — AZITHROMYCIN 250 MG PO TABS: 250.0000 mg | ORAL_TABLET | Freq: Every day | ORAL | 0 refills | Status: DC

## 2016-06-16 MED ORDER — TETANUS-DIPHTH-ACELL PERTUSSIS 5-2.5-18.5 LF-MCG/0.5 IM SUSP
0.5000 mL | Freq: Once | INTRAMUSCULAR | Status: AC
  Administered 2016-06-16: 0.5 mL via INTRAMUSCULAR
  Filled 2016-06-16: qty 0.5

## 2016-06-16 MED ORDER — IBUPROFEN 800 MG PO TABS
800.0000 mg | ORAL_TABLET | Freq: Once | ORAL | Status: AC
  Administered 2016-06-16: 800 mg via ORAL
  Filled 2016-06-16: qty 1

## 2016-06-16 MED ORDER — SODIUM CHLORIDE 0.9 % IV BOLUS (SEPSIS)
1000.0000 mL | Freq: Once | INTRAVENOUS | Status: AC
  Administered 2016-06-16: 1000 mL via INTRAVENOUS

## 2016-06-16 MED ORDER — LIDOCAINE-EPINEPHRINE (PF) 2 %-1:200000 IJ SOLN
10.0000 mL | Freq: Once | INTRAMUSCULAR | Status: DC
  Filled 2016-06-16: qty 20

## 2016-06-16 MED ORDER — CYCLOBENZAPRINE HCL 10 MG PO TABS: 10.0000 mg | ORAL_TABLET | Freq: Two times a day (BID) | ORAL | 0 refills | Status: DC | PRN

## 2016-06-16 MED ORDER — IOPAMIDOL (ISOVUE-300) INJECTION 61%
INTRAVENOUS | Status: AC
  Administered 2016-06-16: 100 mL
  Filled 2016-06-16: qty 100

## 2016-06-16 NOTE — ED Notes (Signed)
Family at bedside. 

## 2016-06-16 NOTE — ED Provider Notes (Signed)
LACERATION REPAIR Performed by: Ok Anis, PA Student Authorized by: Larene Pickett Consent: Verbal consent obtained. Risks and benefits: risks, benefits and alternatives were discussed Consent given by: patient Patient identity confirmed: provided demographic data Prepped and Draped in normal sterile fashion Wound explored  Laceration Location: left scalp  Laceration Length: 3 cm  No Foreign Bodies seen or palpated  Anesthesia: local infiltration  Local anesthetic: lidocaine 2% with epinephrine  Anesthetic total: 5 ml  Irrigation method: syringe Amount of cleaning: standard  Skin closure: staples  Number of staples: 2  Technique: simple interrupted  Patient tolerance: Patient tolerated the procedure well with no immediate complications.    Larene Pickett, PA-C 06/16/16 [place], MD 06/19/16 [id number]

## 2016-06-16 NOTE — ED Notes (Signed)
Given [place] sandwich and ginger ale to drink.

## 2016-06-16 NOTE — ED Notes (Signed)
Pt ambulated with stand by assist from bathroom to stretcher

## 2016-06-16 NOTE — ED Notes (Signed)
Portable xray at bedside.
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
## 2016-06-16 NOTE — ED Notes (Signed)
CT called. Once beta istat results negative pt may go to CT2.

## 2016-06-16 NOTE — ED Notes (Signed)
Pt in CT.

## 2016-06-16 NOTE — ED Notes (Signed)
As patient started getting dressed, she became really dizzy and unable to stand.  EDP made aware.  Per EDP, letting patient eat and drink something and then EDP will reassess.
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
## 2016-06-19 ENCOUNTER — Encounter: Payer: Self-pay | Admitting: Physician Assistant

## 2016-06-19 ENCOUNTER — Encounter: Payer: Self-pay | Admitting: Pulmonary Disease

## 2016-06-19 ENCOUNTER — Ambulatory Visit (INDEPENDENT_AMBULATORY_CARE_PROVIDER_SITE_OTHER): Payer: Commercial Managed Care - PPO | Admitting: Pulmonary Disease

## 2016-06-19 VITALS — BP 120/74 | HR 112 | Ht 63.0 in | Wt 148.6 lb

## 2016-06-19 DIAGNOSIS — R911 Solitary pulmonary nodule: Secondary | ICD-10-CM | POA: Diagnosis not present

## 2016-06-19 NOTE — Patient Instructions (Signed)
We will schedule you for a CT of the chest without contrast in 2-3 months  Return to clinic after CT scan for further evaluation
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
## 2016-06-20 ENCOUNTER — Encounter: Payer: Self-pay | Admitting: Nurse Practitioner

## 2016-06-20 ENCOUNTER — Ambulatory Visit (INDEPENDENT_AMBULATORY_CARE_PROVIDER_SITE_OTHER): Payer: Commercial Managed Care - PPO | Admitting: Nurse Practitioner

## 2016-06-20 DIAGNOSIS — M25511 Pain in right shoulder: Secondary | ICD-10-CM | POA: Diagnosis not present

## 2016-06-20 DIAGNOSIS — S060X9D Concussion with loss of consciousness of unspecified duration, subsequent encounter: Secondary | ICD-10-CM | POA: Diagnosis not present

## 2016-06-20 MED ORDER — MELOXICAM 15 MG PO TABS: 15.0000 mg | ORAL_TABLET | Freq: Every day | ORAL | 0 refills | Status: DC

## 2016-06-20 NOTE — Patient Instructions (Signed)
Concussion, Adult A concussion is a brain injury from a direct hit (blow) to the head or body. This injury causes the brain to shake quickly back and forth inside the skull. It is caused by:  A hit to the head.  A quick and sudden movement (jolt) of the head or neck. How fast you will get better from a concussion depends on many things like how bad your concussion was, what part of your brain was hurt, how old you are, and how healthy you were before the concussion. Recovery can take time. It is important to wait to return to activity until a doctor says it is safe and your symptoms are all gone. Follow these instructions at home: Activity   Limit activities that need a lot of thought or concentration. These include:  Homework or work for your job.  Watching TV.  Computer work.  Playing memory games and puzzles.  Rest. Rest helps the brain to heal. Make sure you:  Get plenty of sleep at night. Do not stay up late.  Go to bed at the same time every day.  Rest during the day. Take naps or rest breaks when you feel tired.  It can be dangerous if you get another concussion before the first one has healed Do not do activities that could cause a second concussion, such as riding a bike or playing sports.  Ask your doctor when you can return to your normal activities, like driving, riding a bike, or using machinery. Your ability to react may be slower. Do not do these activities if you are dizzy. Your doctor will likely give you a plan for slowly going back to activities. General instructions   Take over-the-counter and prescription medicines only as told by your doctor.  Do not drink alcohol until your doctor says you can.  If it is harder than usual to remember things, write them down.  If you are easily distracted, try to do one thing at a time. For example, do not try to watch TV while making dinner.  Talk with family members or close friends when you need to make important  decisions.  Watch your symptoms and tell other people to do the same. Other problems (complications) can happen after a concussion. Older adults with a brain injury may have a higher risk of serious problems, such as a blood clot in the brain.  Tell your teachers, school nurse, school counselor, coach, Product/process development scientist, or work Freight forwarder about your injury and symptoms. Tell them about what you can or cannot do. They should watch for:  More problems with attention or concentration.  More trouble remembering or learning new information.  More time needed to do tasks or assignments.  Being more annoyed (irritable) or having a harder time dealing with stress.  Any other symptoms that get worse.  Keep all follow-up visits as told by your health care provider. This is important. Prevention   It is very important that you donot get another brain injury, especially before you have healed. In rare cases, another injury can cause permanent brain damage, brain swelling, or death. You have the most risk if you get another head injury in the first 7-10 days after you were hurt before. To avoid injuries:  Wear a seat belt when you ride in a car.  Do not drink too much alcohol.  Avoid activities that could make you get a second concussion, like contact sports.  Wear a helmet when you do activities like:  a car.  ? Do not drink too much alcohol.  ? Avoid activities that could make you get a second concussion, like contact sports.  ? Wear a helmet when you do activities like:   Biking.   Skiing.   Skateboarding.   Skating.  ? Make your home safe by:   Removing things from the floor or stairs that could make you trip.   Using grab bars in bathrooms and handrails by stairs.   Placing non-slip mats on floors and in bathtubs.   Putting more light in dark areas.  Contact a doctor if:   Your symptoms get worse.   You have new symptoms.   You keep having symptoms for more than 2 weeks.  Get help right away if:   You have bad headaches, or your headaches get worse.   You have weakness in any part of your body.   You have loss of feeling (numbness).   You feel off  balance.   You keep throwing up (vomiting).   You feel more sleepy.   The black center of one eye (pupil) is bigger than the other one.   You twitch or shake violently (convulse) or have a seizure.   Your speech is not clear (is slurred).   You feel more tired, more confused, or more annoyed.   You do not recognize people or places.   You have neck pain.   It is hard to wake you up.   You have strange behavior changes.   You pass out (lose consciousness).  Summary   A concussion is a brain injury from a direct hit (blow) to the head or body.   This condition is treated with rest and careful watching of symptoms.   If you keep having symptoms for more than 2 weeks, call your doctor.  This information is not intended to replace advice given to you by your health care provider. Make sure you discuss any questions you have with your health care provider.  Document Released: 03/01/2009 Document Revised: 02/26/2016 Document Reviewed: 02/26/2016  Elsevier Interactive Patient Education  2017 [employer]vier Inc[employer]
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
## 2016-06-26 ENCOUNTER — Ambulatory Visit: Payer: Commercial Managed Care - PPO | Admitting: Family Medicine

## 2016-06-27 ENCOUNTER — Ambulatory Visit: Payer: Commercial Managed Care - PPO | Admitting: Family Medicine

## 2016-07-17 ENCOUNTER — Other Ambulatory Visit: Payer: Self-pay | Admitting: Nurse Practitioner

## 2016-07-17 DIAGNOSIS — M25511 Pain in right shoulder: Secondary | ICD-10-CM

## 2016-08-22 ENCOUNTER — Ambulatory Visit (INDEPENDENT_AMBULATORY_CARE_PROVIDER_SITE_OTHER)
Admission: RE | Admit: 2016-08-22 | Discharge: 2016-08-22 | Disposition: A | Discharge status: Home / Self Care | Payer: Commercial Managed Care - PPO | Source: Ambulatory Visit | Attending: Pulmonary Disease | Admitting: Pulmonary Disease

## 2016-08-22 ENCOUNTER — Ambulatory Visit: Payer: Commercial Managed Care - PPO | Admitting: Pulmonary Disease

## 2016-08-22 DIAGNOSIS — R911 Solitary pulmonary nodule: Secondary | ICD-10-CM

## 2016-08-24 ENCOUNTER — Ambulatory Visit (INDEPENDENT_AMBULATORY_CARE_PROVIDER_SITE_OTHER): Payer: Commercial Managed Care - PPO | Admitting: Pulmonary Disease

## 2016-08-24 ENCOUNTER — Encounter: Payer: Self-pay | Admitting: Pulmonary Disease

## 2016-08-24 ENCOUNTER — Telehealth: Payer: Self-pay | Admitting: Pulmonary Disease

## 2016-08-24 ENCOUNTER — Other Ambulatory Visit (INDEPENDENT_AMBULATORY_CARE_PROVIDER_SITE_OTHER): Payer: Commercial Managed Care - PPO

## 2016-08-24 VITALS — BP 128/74 | HR 118 | Ht 63.0 in | Wt 150.0 lb

## 2016-08-24 DIAGNOSIS — R911 Solitary pulmonary nodule: Secondary | ICD-10-CM

## 2016-08-24 DIAGNOSIS — R0602 Shortness of breath: Secondary | ICD-10-CM | POA: Diagnosis not present

## 2016-08-24 LAB — CBC WITH DIFFERENTIAL/PLATELET
BASOS ABS: 0 10*3/uL (ref 0.0–0.1)
Basophils Relative: 0.4 % (ref 0.0–3.0)
EOS PCT: 0.4 % (ref 0.0–5.0)
Eosinophils Absolute: 0 10*3/uL (ref 0.0–0.7)
HCT: 39 % (ref 36.0–46.0)
HEMOGLOBIN: 13.1 g/dL (ref 12.0–15.0)
LYMPHS ABS: 0.7 10*3/uL (ref 0.7–4.0)
Lymphocytes Relative: 10.9 % — ABNORMAL LOW (ref 12.0–46.0)
MCHC: 33.6 g/dL (ref 30.0–36.0)
MCV: 88.8 fl (ref 78.0–100.0)
MONO ABS: 0.5 10*3/uL (ref 0.1–1.0)
MONOS PCT: 8 % (ref 3.0–12.0)
NEUTROS PCT: 80.3 % — AB (ref 43.0–77.0)
Neutro Abs: 5.1 10*3/uL (ref 1.4–7.7)
Platelets: 354 10*3/uL (ref 150.0–400.0)
RBC: 4.39 Mil/uL (ref 3.87–5.11)
RDW: 14.2 % (ref 11.5–15.5)
WBC: 6.3 10*3/uL (ref 4.0–10.5)

## 2016-08-24 LAB — BASIC METABOLIC PANEL
BUN: 17 mg/dL (ref 6–23)
CALCIUM: 10 mg/dL (ref 8.4–10.5)
CHLORIDE: 102 meq/L (ref 96–112)
CO2: 29 meq/L (ref 19–32)
Creatinine, Ser: 0.93 mg/dL (ref 0.40–1.20)
GFR: 68.3 mL/min (ref >60.00)
GLUCOSE: 97 mg/dL (ref 70–99)
Potassium: 3.7 mEq/L (ref 3.5–5.1)
SODIUM: 136 meq/L (ref 135–145)

## 2016-08-24 LAB — PROTIME-INR
INR: 0.9 ratio (ref 0.8–1.0)
PROTHROMBIN TIME: 10.2 s (ref 9.6–13.1)

## 2016-08-24 LAB — C-REACTIVE PROTEIN: CRP: 1.3 mg/dL (ref 0.5–20.0)
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
## 2016-08-24 NOTE — Patient Instructions (Signed)
We will check labs today including CBC with diff, BMP, PT, INR ACE levels, ANA with reflex, ANCA, CCP, RF and CRP  We will work on scheduling you for a bronchoscopy with biopsy for further evaluation of your lung nodules. Please expect to hear from our office this week or early next week.. Return in 1-2 months.

## 2016-08-24 NOTE — Telephone Encounter (Signed)
Called and spoke to pt. Pt states she will out of town from 09/16/16-09/23/16 and will not be able to have bronch during this time.   Will send to PM to advise when he would like pt scheduled for bronch. Thanks.

## 2016-08-25 LAB — CYCLIC CITRUL PEPTIDE ANTIBODY, IGG

## 2016-08-25 LAB — RHEUMATOID FACTOR: Rhuematoid fact SerPl-aCnc: <14 IU/mL (ref <14)

## 2016-08-25 LAB — ANCA SCREEN W REFLEX TITER: ANCA SCREEN: NEGATIVE

## 2016-08-25 LAB — ANA W/REFLEX: Anti Nuclear Antibody(ANA): NEGATIVE

## 2016-08-25 NOTE — Telephone Encounter (Signed)
PM please advise of any dates that you will be able to have the bronch rescheduled.  Thanks

## 2016-08-25 NOTE — Telephone Encounter (Signed)
I can do the week of 11th at [facility] or week of 18th at [facility]. Please check with [facility] when we can get time in OR for EBUS.  Thanks PM

## 2016-08-25 NOTE — Telephone Encounter (Signed)
OK to schedule on 6/20. Thanks

## 2016-08-25 NOTE — Telephone Encounter (Signed)
Spoke to pt she is aware of this appt ebus@[facility]  09/13/16@8 :30 and she has a preop appt on 09/09/16 Brenda Walker

## 2016-08-25 NOTE — Telephone Encounter (Signed)
Unable to schedule on 09/04/16@[facility]  so EBUS will be @[facility]  09/13/16@8 :30am main OR if this is ok I will call the pt Joellen Jersey

## 2016-08-30 LAB — ANGIOTENSIN CONVERTING ENZYME: Angiotensin-Converting Enzyme: 53 U/L (ref 9–67)

## 2016-09-04 ENCOUNTER — Other Ambulatory Visit: Payer: Self-pay | Admitting: Nurse Practitioner

## 2016-09-04 DIAGNOSIS — M25511 Pain in right shoulder: Secondary | ICD-10-CM

## 2016-09-05 NOTE — Pre-Procedure Instructions (Signed)
ORA BOLLIG  09/05/2016      [facility] [address], [place] [address] [place] [address] Phone: [phone] Fax: [phone]  [place], [place] - [address] [place] [address] Phone: [phone] Fax: [phone]  [facility] [place] to [place] [address] Phone: [phone] Fax: [phone]  [id number] [place], [place] [address] Phone: [phone] Fax: [phone]  [id number] - [place], [place] [address] Phone: [phone] Fax: [phone]    Your procedure is scheduled on June 20  Report to [place] at 630 A.M.  Call this number if you have problems the morning of surgery:  [phone]   Remember:  Do not eat food or drink liquids after midnight.  Take these medicines the morning of surgery with A SIP OF WATER -  Stop taking aspirin, BC's, Goody's, herbal medications, Fish Oil, Ibuprofen, Advil, Motrin, Aleve, Vitamins   Do not wear jewelry, make-up or nail polish.  Do not wear lotions, powders, or perfumes, or deoderant.  Do not shave 48 hours prior to surgery.  Men may shave face and neck.  Do not bring valuables to the hospital.  [facility] is not responsible for any belongings or valuables.  Contacts, dentures or bridgework may not be worn into surgery.  Leave your suitcase in the car.  After surgery it may be brought to your room.  For patients admitted to the hospital, discharge time will be determined by your treatment team.  Patients discharged the day of surgery will not be allowed to drive home.  Special instructions:  [place] - Preparing for Surgery  Before surgery, you can play an  important role.  Because skin is not sterile, your skin needs to be as free of germs as possible.  You can reduce the number of germs on you skin by washing with CHG (chlorahexidine gluconate) soap before surgery.  CHG is an antiseptic cleaner which kills germs and bonds with the skin to continue killing germs even after washing.  Please DO NOT use if you have an allergy to CHG or antibacterial soaps.  If your skin becomes reddened/irritated stop using the CHG and inform your nurse when you arrive at Short Stay.  Do not shave (including legs and underarms) for at least 48 hours prior to the first CHG shower.  You may shave your face.  Please follow these instructions carefully:   1.  Shower with CHG Soap the night before surgery and the    morning of Surgery.  2.  If you choose to wash your hair, wash your hair first as usual with your  normal shampoo.  3.  After you shampoo, rinse your hair and body thoroughly to remove the Shampoo.  4.  Use CHG as you would any other liquid soap.  You can apply chg directly   to the skin and wash gently with scrungie or a clean washcloth.  5.  Apply the CHG Soap to your body ONLY FROM THE NECK DOWN.   Do not use on open wounds or open sores.  Avoid contact with your eyes,       ears, mouth and  genitals (private parts).  Wash genitals (private parts)  with your normal soap.  6.  Wash thoroughly, paying special attention to the area where your surgery  will be performed.  7.  Thoroughly rinse your body with warm water from the neck down.  8.  DO NOT shower/wash with your normal soap after using and rinsing off the CHG Soap.  9.  Pat yourself dry with a clean towel.            10.  Wear clean pajamas.            11.  Place clean sheets on your bed the night of your first shower and do not sleep with pets.  Day of Surgery  Do not apply any lotions/deoderants the morning of surgery.  Please wear clean clothes to the hospital/surgery center.     Please read  over the following fact sheets that you were given. Pain Booklet, Coughing and Deep Breathing and Surgical Site Infection Prevention

## 2016-09-06 ENCOUNTER — Encounter (HOSPITAL_COMMUNITY)
Admission: RE | Admit: 2016-09-06 | Discharge: 2016-09-06 | Disposition: A | Discharge status: Home / Self Care | Payer: Commercial Managed Care - PPO | Source: Ambulatory Visit | Attending: Pulmonary Disease | Admitting: Pulmonary Disease

## 2016-09-06 ENCOUNTER — Other Ambulatory Visit: Payer: Self-pay | Admitting: Physician Assistant

## 2016-09-06 ENCOUNTER — Encounter (HOSPITAL_COMMUNITY): Payer: Self-pay

## 2016-09-06 DIAGNOSIS — Z01818 Encounter for other preprocedural examination: Secondary | ICD-10-CM | POA: Diagnosis not present

## 2016-09-06 DIAGNOSIS — R918 Other nonspecific abnormal finding of lung field: Secondary | ICD-10-CM | POA: Insufficient documentation

## 2016-09-06 DIAGNOSIS — R590 Localized enlarged lymph nodes: Secondary | ICD-10-CM | POA: Diagnosis not present

## 2016-09-06 DIAGNOSIS — F411 Generalized anxiety disorder: Secondary | ICD-10-CM

## 2016-09-06 HISTORY — DX: Concussion with loss of consciousness of unspecified duration, initial encounter: S06.0X9A

## 2016-09-06 HISTORY — DX: Family history of other specified conditions: Z84.89

## 2016-09-06 HISTORY — DX: Dyspnea, unspecified: R06.00

## 2016-09-06 LAB — HCG, SERUM, QUALITATIVE: Preg, Serum: NEGATIVE

## 2016-09-06 LAB — CBC
HCT: 39.3 % (ref 36.0–46.0)
Hemoglobin: 12.9 g/dL (ref 12.0–15.0)
MCH: 29.5 pg (ref 26.0–34.0)
MCHC: 32.8 g/dL (ref 30.0–36.0)
MCV: 89.9 fL (ref 78.0–100.0)
PLATELETS: 328 10*3/uL (ref 150–400)
RBC: 4.37 MIL/uL (ref 3.87–5.11)
RDW: 14.6 % (ref 11.5–15.5)
WBC: 5.2 10*3/uL (ref 4.0–10.5)

## 2016-09-06 LAB — BASIC METABOLIC PANEL
Anion gap: 7 (ref 5–15)
BUN: 11 mg/dL (ref 6–20)
CHLORIDE: 106 mmol/L (ref 101–111)
CO2: 24 mmol/L (ref 22–32)
CREATININE: 0.89 mg/dL (ref 0.44–1.00)
Calcium: 9.8 mg/dL (ref 8.9–10.3)
GFR calc Af Amer: >60 mL/min (ref >60)
GFR calc non Af Amer: >60 mL/min (ref >60)
Glucose, Bld: 97 mg/dL (ref 65–99)
Potassium: 5 mmol/L (ref 3.5–5.1)
Sodium: 137 mmol/L (ref 135–145)

## 2016-09-06 NOTE — Progress Notes (Signed)
PCP: [facility] in [place], [place]  Stress test done> 10 yrs, normal ( ordered for irr-reg heart beat), no other studies done

## 2016-09-06 NOTE — Pre-Procedure Instructions (Addendum)
ETANA BEETS  09/06/2016      [facility] [address], [place] [address] [place] [address] Phone: [phone] Fax: [phone]  [place], [place] - [address] [place] [address] Phone: [phone] Fax: [phone]  [facility] [place] to [place] [address] Phone: [phone] Fax: [phone]  [id number] - [place], [place] [address] Phone: [phone] Fax: [phone]  [id number] - [place], [place] [address] Phone: [phone] Fax: [phone]    Your procedure is scheduled on June 20  Report to [place] at 630 A.M.  Call this number if you have problems the morning of surgery:  [phone]   Remember:  Do not eat food or drink liquids after midnight.  Take these medicines the morning of surgery with A SIP OF WATER -lexapro if needed, flonase nasal spray  Stop taking aspirin, BC's, Goody's, herbal medications, Fish Oil, Ibuprofen, Advil, Motrin, Aleve, Vitamins   Do not wear jewelry, make-up or nail polish.  Do not wear lotions, powders, or perfumes, or deoderant.  Do not shave 48 hours prior to surgery.  Men may shave face and neck.  Do not bring valuables to the hospital.  [facility] is not responsible for any belongings or valuables.  Contacts, dentures or bridgework may not be worn into surgery.  Leave your suitcase in the car.  After surgery it may be brought to your room.  For patients admitted to the hospital, discharge time will be determined by your treatment team.  Patients discharged the day of surgery will not be allowed to drive home.  Special instructions:  [place] - Preparing for  Surgery  Before surgery, you can play an important role.  Because skin is not sterile, your skin needs to be as free of germs as possible.  You can reduce the number of germs on you skin by washing with CHG (chlorahexidine gluconate) soap before surgery.  CHG is an antiseptic cleaner which kills germs and bonds with the skin to continue killing germs even after washing.  Please DO NOT use if you have an allergy to CHG or antibacterial soaps.  If your skin becomes reddened/irritated stop using the CHG and inform your nurse when you arrive at Short Stay.  Do not shave (including legs and underarms) for at least 48 hours prior to the first CHG shower.  You may shave your face.  Please follow these instructions carefully:   1.  Shower with CHG Soap the night before surgery and the    morning of Surgery.  2.  If you choose to wash your hair, wash your hair first as usual with your  normal shampoo.  3.  After you shampoo, rinse your hair and body thoroughly to remove the Shampoo.  4.  Use CHG as you would any other liquid soap.  You can apply chg directly   to the skin and wash gently with scrungie or a clean washcloth.  5.  Apply the CHG Soap to your body ONLY FROM THE NECK DOWN.   Do not use on open wounds or open sores.  Avoid contact with your eyes,  ears, mouth and genitals (private parts).  Wash genitals (private parts)  with your normal soap.  6.  Wash thoroughly, paying special attention to the area where your surgery  will be performed.  7.  Thoroughly rinse your body with warm water from the neck down.  8.  DO NOT shower/wash with your normal soap after using and rinsing off the CHG Soap.  9.  Pat yourself dry with a clean towel.            10.  Wear clean pajamas.            11.  Place clean sheets on your bed the night of your first shower and do not sleep with pets.  Day of Surgery  Do not apply any lotions/deoderants the morning of surgery.  Please wear clean clothes to the  hospital/surgery center.     Please read over the following fact sheets that you were given. Pain Booklet, Coughing and Deep Breathing and Surgical Site Infection Prevention

## 2016-09-12 NOTE — Anesthesia Preprocedure Evaluation (Addendum)
Anesthesia Evaluation  Patient identified by MRN, date of birth, ID band Patient awake    Reviewed: Allergy & Precautions, NPO status , Patient's Chart, lab work & pertinent test results  History of Anesthesia Complications (+) PONV  Airway Mallampati: II  TM Distance: >3 FB Neck ROM: Full    Dental  (+) Dental Advisory Given   Pulmonary neg pulmonary ROS,  Pulmonary nodules. Possible sarcoid   breath sounds clear to auscultation       Cardiovascular negative cardio ROS   Rhythm:Regular Rate:Normal     Neuro/Psych negative neurological ROS     GI/Hepatic negative GI ROS, Neg liver ROS,   Endo/Other  negative endocrine ROS  Renal/GU negative Renal ROS     Musculoskeletal   Abdominal   Peds  Hematology negative hematology ROS (+)   Anesthesia Other Findings   Reproductive/Obstetrics                            Lab Results  Component Value Date   WBC 5.2 09/06/2016   HGB 12.9 09/06/2016   HCT 39.3 09/06/2016   MCV 89.9 09/06/2016   PLT 328 09/06/2016   Lab Results  Component Value Date   CREATININE 0.89 09/06/2016   BUN 11 09/06/2016   NA 137 09/06/2016   K 5.0 09/06/2016   CL 106 09/06/2016   CO2 24 09/06/2016    Anesthesia Physical Anesthesia Plan  ASA: I  Anesthesia Plan: General   Post-op Pain Management:    Induction: Intravenous  PONV Risk Score and Plan: 4 or greater and Ondansetron, Dexamethasone, Propofol, Midazolam and Scopolamine patch - Pre-op  Airway Management Planned: Oral ETT  Additional Equipment:   Intra-op Plan:   Post-operative Plan: Extubation in OR  Informed Consent: I have reviewed the patients History and Physical, chart, labs and discussed the procedure including the risks, benefits and alternatives for the proposed anesthesia with the patient or authorized representative who has indicated his/her understanding and acceptance.   Dental  advisory given  Plan Discussed with: CRNA  Anesthesia Plan Comments:        Anesthesia Quick Evaluation

## 2016-09-13 ENCOUNTER — Encounter (HOSPITAL_COMMUNITY): Payer: Self-pay | Admitting: *Deleted

## 2016-09-13 ENCOUNTER — Inpatient Hospital Stay (HOSPITAL_COMMUNITY): Payer: Commercial Managed Care - PPO

## 2016-09-13 ENCOUNTER — Ambulatory Visit (HOSPITAL_COMMUNITY): Payer: Commercial Managed Care - PPO | Admitting: Certified Registered Nurse Anesthetist

## 2016-09-13 ENCOUNTER — Encounter: Payer: Self-pay | Admitting: Family Medicine

## 2016-09-13 ENCOUNTER — Ambulatory Visit (HOSPITAL_COMMUNITY): Payer: Commercial Managed Care - PPO

## 2016-09-13 ENCOUNTER — Observation Stay (HOSPITAL_COMMUNITY)
Admission: RE | Admit: 2016-09-13 | Discharge: 2016-09-14 | Disposition: A | Discharge status: Home / Self Care | Payer: Commercial Managed Care - PPO | Source: Ambulatory Visit | Attending: Pulmonary Disease | Admitting: Pulmonary Disease

## 2016-09-13 ENCOUNTER — Encounter (HOSPITAL_COMMUNITY)
Admission: RE | Disposition: A | Discharge status: Home / Self Care | Payer: Self-pay | Source: Ambulatory Visit | Attending: Pulmonary Disease

## 2016-09-13 DIAGNOSIS — R918 Other nonspecific abnormal finding of lung field: Secondary | ICD-10-CM | POA: Diagnosis not present

## 2016-09-13 DIAGNOSIS — J329 Chronic sinusitis, unspecified: Secondary | ICD-10-CM | POA: Diagnosis not present

## 2016-09-13 DIAGNOSIS — J95811 Postprocedural pneumothorax: Secondary | ICD-10-CM

## 2016-09-13 DIAGNOSIS — Z79899 Other long term (current) drug therapy: Secondary | ICD-10-CM | POA: Diagnosis not present

## 2016-09-13 DIAGNOSIS — J939 Pneumothorax, unspecified: Secondary | ICD-10-CM | POA: Diagnosis present

## 2016-09-13 DIAGNOSIS — Z880 Allergy status to penicillin: Secondary | ICD-10-CM | POA: Diagnosis not present

## 2016-09-13 DIAGNOSIS — Z791 Long term (current) use of non-steroidal anti-inflammatories (NSAID): Secondary | ICD-10-CM | POA: Diagnosis not present

## 2016-09-13 DIAGNOSIS — Z9889 Other specified postprocedural states: Secondary | ICD-10-CM

## 2016-09-13 DIAGNOSIS — Z419 Encounter for procedure for purposes other than remedying health state, unspecified: Secondary | ICD-10-CM

## 2016-09-13 DIAGNOSIS — Y838 Other surgical procedures as the cause of abnormal reaction of the patient, or of later complication, without mention of misadventure at the time of the procedure: Secondary | ICD-10-CM | POA: Diagnosis not present

## 2016-09-13 DIAGNOSIS — J8410 Pulmonary fibrosis, unspecified: Secondary | ICD-10-CM | POA: Insufficient documentation

## 2016-09-13 DIAGNOSIS — Z7952 Long term (current) use of systemic steroids: Secondary | ICD-10-CM | POA: Insufficient documentation

## 2016-09-13 DIAGNOSIS — F419 Anxiety disorder, unspecified: Secondary | ICD-10-CM | POA: Insufficient documentation

## 2016-09-13 DIAGNOSIS — J9383 Other pneumothorax: Secondary | ICD-10-CM | POA: Diagnosis not present

## 2016-09-13 DIAGNOSIS — R9389 Abnormal findings on diagnostic imaging of other specified body structures: Secondary | ICD-10-CM | POA: Insufficient documentation

## 2016-09-13 HISTORY — PX: VIDEO BRONCHOSCOPY WITH ENDOBRONCHIAL ULTRASOUND: SHX6177

## 2016-09-13 LAB — CBC
HCT: 35.8 % — ABNORMAL LOW (ref 36.0–46.0)
HEMOGLOBIN: 11.8 g/dL — AB (ref 12.0–15.0)
MCH: 29.3 pg (ref 26.0–34.0)
MCHC: 33 g/dL (ref 30.0–36.0)
MCV: 88.8 fL (ref 78.0–100.0)
PLATELETS: 309 10*3/uL (ref 150–400)
RBC: 4.03 MIL/uL (ref 3.87–5.11)
RDW: 14.6 % (ref 11.5–15.5)
WBC: 11 10*3/uL — AB (ref 4.0–10.5)

## 2016-09-13 LAB — BASIC METABOLIC PANEL
ANION GAP: 7 (ref 5–15)
BUN: 8 mg/dL (ref 6–20)
CHLORIDE: 105 mmol/L (ref 101–111)
CO2: 25 mmol/L (ref 22–32)
Calcium: 9 mg/dL (ref 8.9–10.3)
Creatinine, Ser: 0.88 mg/dL (ref 0.44–1.00)
Glucose, Bld: 122 mg/dL — ABNORMAL HIGH (ref 65–99)
POTASSIUM: 3.6 mmol/L (ref 3.5–5.1)
Sodium: 137 mmol/L (ref 135–145)

## 2016-09-13 LAB — MAGNESIUM: Magnesium: 1.6 mg/dL — ABNORMAL LOW (ref 1.7–2.4)

## 2016-09-13 LAB — PHOSPHORUS: PHOSPHORUS: 2.5 mg/dL (ref 2.5–4.6)

## 2016-09-13 SURGERY — BRONCHOSCOPY, WITH EBUS
Anesthesia: General | Site: Chest

## 2016-09-13 MED ORDER — PHENYLEPHRINE 40 MCG/ML (10ML) SYRINGE FOR IV PUSH (FOR BLOOD PRESSURE SUPPORT)
PREFILLED_SYRINGE | INTRAVENOUS | Status: AC
  Filled 2016-09-13: qty 10

## 2016-09-13 MED ORDER — EPHEDRINE SULFATE 50 MG/ML IJ SOLN
INTRAMUSCULAR | Status: DC | PRN
  Administered 2016-09-13: 5 mg via INTRAVENOUS

## 2016-09-13 MED ORDER — MIDAZOLAM HCL 5 MG/5ML IJ SOLN
INTRAMUSCULAR | Status: DC | PRN
  Administered 2016-09-13: 2 mg via INTRAVENOUS

## 2016-09-13 MED ORDER — ACETAMINOPHEN 325 MG PO TABS
650.0000 mg | ORAL_TABLET | Freq: Four times a day (QID) | ORAL | Status: DC | PRN
  Administered 2016-09-13: 650 mg via ORAL
  Filled 2016-09-13: qty 2

## 2016-09-13 MED ORDER — MIDAZOLAM HCL 2 MG/2ML IJ SOLN
INTRAMUSCULAR | Status: AC
  Filled 2016-09-13: qty 2

## 2016-09-13 MED ORDER — FENTANYL CITRATE (PF) 250 MCG/5ML IJ SOLN
INTRAMUSCULAR | Status: AC
  Filled 2016-09-13: qty 5

## 2016-09-13 MED ORDER — LACTATED RINGERS IV SOLN
INTRAVENOUS | Status: DC | PRN
  Administered 2016-09-13 (×2): via INTRAVENOUS

## 2016-09-13 MED ORDER — 0.9 % SODIUM CHLORIDE (POUR BTL) OPTIME
TOPICAL | Status: DC | PRN
  Administered 2016-09-13: 1000 mL

## 2016-09-13 MED ORDER — EPINEPHRINE PF 1 MG/ML IJ SOLN
INTRAMUSCULAR | Status: AC
  Filled 2016-09-13: qty 1

## 2016-09-13 MED ORDER — OXYCODONE HCL 5 MG PO TABS
5.0000 mg | ORAL_TABLET | ORAL | Status: DC | PRN
Start: 2016-09-13 — End: 2016-09-14

## 2016-09-13 MED ORDER — FENTANYL CITRATE (PF) 100 MCG/2ML IJ SOLN
INTRAMUSCULAR | Status: DC | PRN
  Administered 2016-09-13: 100 ug via INTRAVENOUS
  Administered 2016-09-13: 50 ug via INTRAVENOUS

## 2016-09-13 MED ORDER — LIDOCAINE HCL (CARDIAC) 20 MG/ML IV SOLN
INTRAVENOUS | Status: DC | PRN
  Administered 2016-09-13: 50 mg via INTRATRACHEAL

## 2016-09-13 MED ORDER — HEPARIN SODIUM (PORCINE) 5000 UNIT/ML IJ SOLN
5000.0000 [IU] | Freq: Three times a day (TID) | INTRAMUSCULAR | Status: DC
  Administered 2016-09-13 – 2016-09-14 (×4): 5000 [IU] via SUBCUTANEOUS
  Filled 2016-09-13 (×4): qty 1

## 2016-09-13 MED ORDER — DEXAMETHASONE SODIUM PHOSPHATE 10 MG/ML IJ SOLN
INTRAMUSCULAR | Status: DC | PRN
  Administered 2016-09-13: 10 mg via INTRAVENOUS

## 2016-09-13 MED ORDER — SCOPOLAMINE 1 MG/3DAYS TD PT72
MEDICATED_PATCH | TRANSDERMAL | Status: DC | PRN
  Administered 2016-09-13: 1 via TRANSDERMAL

## 2016-09-13 MED ORDER — SCOPOLAMINE 1 MG/3DAYS TD PT72
MEDICATED_PATCH | TRANSDERMAL | Status: AC
  Filled 2016-09-13: qty 1

## 2016-09-13 MED ORDER — ONDANSETRON HCL 4 MG/2ML IJ SOLN
INTRAMUSCULAR | Status: AC
  Filled 2016-09-13: qty 2

## 2016-09-13 MED ORDER — ROCURONIUM BROMIDE 100 MG/10ML IV SOLN
INTRAVENOUS | Status: DC | PRN
  Administered 2016-09-13: 40 mg via INTRAVENOUS
  Administered 2016-09-13: 10 mg via INTRAVENOUS

## 2016-09-13 MED ORDER — PROMETHAZINE HCL 25 MG/ML IJ SOLN: 6.2500 mg | INTRAMUSCULAR | Status: DC | PRN

## 2016-09-13 MED ORDER — DEXAMETHASONE SODIUM PHOSPHATE 10 MG/ML IJ SOLN
INTRAMUSCULAR | Status: AC
  Filled 2016-09-13: qty 1

## 2016-09-13 MED ORDER — PHENYLEPHRINE HCL 10 MG/ML IJ SOLN
INTRAMUSCULAR | Status: DC | PRN
  Administered 2016-09-13 (×4): 80 ug via INTRAVENOUS

## 2016-09-13 MED ORDER — PHENYLEPHRINE HCL 10 MG/ML IJ SOLN
INTRAVENOUS | Status: DC | PRN
  Administered 2016-09-13: 20 ug/min via INTRAVENOUS

## 2016-09-13 MED ORDER — FENTANYL CITRATE (PF) 100 MCG/2ML IJ SOLN: 25.0000 ug | INTRAMUSCULAR | Status: DC | PRN

## 2016-09-13 MED ORDER — PROPOFOL 10 MG/ML IV BOLUS
INTRAVENOUS | Status: AC
  Filled 2016-09-13: qty 20

## 2016-09-13 MED ORDER — SUGAMMADEX SODIUM 200 MG/2ML IV SOLN
INTRAVENOUS | Status: AC
  Filled 2016-09-13: qty 2

## 2016-09-13 MED ORDER — ONDANSETRON HCL 4 MG/2ML IJ SOLN
INTRAMUSCULAR | Status: DC | PRN
  Administered 2016-09-13: 4 mg via INTRAVENOUS

## 2016-09-13 MED ORDER — LIDOCAINE 2% (20 MG/ML) 5 ML SYRINGE
INTRAMUSCULAR | Status: AC
  Filled 2016-09-13: qty 5

## 2016-09-13 MED ORDER — ROCURONIUM BROMIDE 10 MG/ML (PF) SYRINGE
PREFILLED_SYRINGE | INTRAVENOUS | Status: AC
  Filled 2016-09-13: qty 5

## 2016-09-13 MED ORDER — GLYCOPYRROLATE 0.2 MG/ML IJ SOLN
INTRAMUSCULAR | Status: DC | PRN
  Administered 2016-09-13: 0.6 mg via INTRAVENOUS

## 2016-09-13 MED ORDER — PROPOFOL 10 MG/ML IV BOLUS
INTRAVENOUS | Status: DC | PRN
  Administered 2016-09-13: 150 mg via INTRAVENOUS

## 2016-09-13 MED ORDER — ESCITALOPRAM OXALATE 5 MG PO TABS
5.0000 mg | ORAL_TABLET | Freq: Every day | ORAL | Status: DC
  Filled 2016-09-13: qty 1

## 2016-09-13 MED ORDER — NEOSTIGMINE METHYLSULFATE 10 MG/10ML IV SOLN
INTRAVENOUS | Status: DC | PRN
  Administered 2016-09-13: 4 mg via INTRAVENOUS

## 2016-09-13 SURGICAL SUPPLY — 30 items
BAG SNAP BAND KOVER 36X36 (MISCELLANEOUS) ×3 IMPLANT
BRUSH CYTOL CELLEBRITY 1.5X140 (MISCELLANEOUS) IMPLANT
CANISTER SUCT 3000ML PPV (MISCELLANEOUS) ×3 IMPLANT
CONT SPEC 4OZ CLIKSEAL STRL BL (MISCELLANEOUS) ×24 IMPLANT
COVER BACK TABLE 60X90IN (DRAPES) ×3 IMPLANT
COVER DOME SNAP 22 D (MISCELLANEOUS) IMPLANT
FILTER STRAW FLUID ASPIR (MISCELLANEOUS) IMPLANT
FORCEPS BIOP RJ4 1.8 (CUTTING FORCEPS) ×3 IMPLANT
GAUZE SPONGE 4X4 12PLY STRL (GAUZE/BANDAGES/DRESSINGS) ×3 IMPLANT
GLOVE BIO SURGEON STRL SZ7.5 (GLOVE) ×3 IMPLANT
GOWN STRL REUS W/ TWL LRG LVL3 (GOWN DISPOSABLE) ×2 IMPLANT
GOWN STRL REUS W/TWL LRG LVL3 (GOWN DISPOSABLE) ×4
KIT CLEAN ENDO COMPLIANCE (KITS) ×6 IMPLANT
KIT ROOM TURNOVER OR (KITS) ×3 IMPLANT
MARKER SKIN DUAL TIP RULER LAB (MISCELLANEOUS) ×3 IMPLANT
NEEDLE ECHOTIP HI DEF 22GA (NEEDLE) ×3 IMPLANT
NEEDLE SONO TIP II EBUS (NEEDLE) ×3 IMPLANT
NS IRRIG 1000ML POUR BTL (IV SOLUTION) ×3 IMPLANT
OIL SILICONE PENTAX (PARTS (SERVICE/REPAIRS)) ×3 IMPLANT
PAD ARMBOARD 7.5X6 YLW CONV (MISCELLANEOUS) ×6 IMPLANT
SYR 20CC LL (SYRINGE) IMPLANT
SYR 20ML ECCENTRIC (SYRINGE) ×9 IMPLANT
SYR 3ML LL SCALE MARK (SYRINGE) IMPLANT
SYR 50ML SLIP (SYRINGE) ×9 IMPLANT
SYR 5ML LUER SLIP (SYRINGE) ×3 IMPLANT
TOWEL OR 17X24 6PK STRL BLUE (TOWEL DISPOSABLE) ×3 IMPLANT
TRAP SPECIMEN MUCOUS 40CC (MISCELLANEOUS) ×3 IMPLANT
TUBE CONNECTING 20'X1/4 (TUBING) ×1
TUBE CONNECTING 20X1/4 (TUBING) ×2 IMPLANT
WATER STERILE IRR 1000ML POUR (IV SOLUTION) ×3 IMPLANT

## 2016-09-13 NOTE — Discharge Instructions (Signed)
General Anesthesia, Adult, Care After °These instructions provide you with information about caring for yourself after your procedure. Your health care provider may also give you more specific instructions. Your treatment has been planned according to current medical practices, but problems sometimes occur. Call your health care provider if you have any problems or questions after your procedure. °What can I expect after the procedure? °After the procedure, it is common to have: °· Vomiting. °· A sore throat. °· Mental slowness. ° °It is common to feel: °· Nauseous. °· Cold or shivery. °· Sleepy. °· Tired. °· Sore or achy, even in parts of your body where you did not have surgery. ° °Follow these instructions at home: °For at least 24 hours after the procedure: °· Do not: °? Participate in activities where you could fall or become injured. °? Drive. °? Use heavy machinery. °? Drink alcohol. °? Take sleeping pills or medicines that cause drowsiness. °? Make important decisions or sign legal documents. °? Take care of children on your own. °· Rest. °Eating and drinking °· If you vomit, drink water, juice, or soup when you can drink without vomiting. °· Drink enough fluid to keep your urine clear or pale yellow. °· Make sure you have little or no nausea before eating solid foods. °· Follow the diet recommended by your health care provider. °General instructions °· Have a responsible adult stay with you until you are awake and alert. °· Return to your normal activities as told by your health care provider. Ask your health care provider what activities are safe for you. °· Take over-the-counter and prescription medicines only as told by your health care provider. °· If you smoke, do not smoke without supervision. °· Keep all follow-up visits as told by your health care provider. This is important. °Contact a health care provider if: °· You continue to have nausea or vomiting at home, and medicines are not helpful. °· You  cannot drink fluids or start eating again. °· You cannot urinate after 8-12 hours. °· You develop a skin rash. °· You have fever. °· You have increasing redness at the site of your procedure. °Get help right away if: °· You have difficulty breathing. °· You have chest pain. °· You have unexpected bleeding. °· You feel that you are having a life-threatening or urgent problem. °This information is not intended to replace advice given to you by your health care provider. Make sure you discuss any questions you have with your health care provider. °Document Released: 06/19/2000 Document Revised: 08/16/2015 Document Reviewed: 02/25/2015 °Elsevier Interactive Patient Education © 2018 [employer] ° °

## 2016-09-13 NOTE — Op Note (Signed)
Video Bronchoscopy with Endobronchial Ultrasound  Date of Operation: 01/05/2016  Pre-op Diagnosis: Lung nodules and mediastinal LAD  Post-op Diagnosis: Same  Surgeon: Marshell Garfinkel  Assistants:   Anesthesia: General endotracheal anesthesia  Operation: Flexible video fiberoptic bronchoscopy with endobronchial ultrasound and biopsies.  Estimated Blood Loss: Minimal  Complications: None apparent  Indications and History: Brenda Walker is a  46 y.o. female with hx bilateral upper lobe predominant nodular opacities on CT chest. Also has mediastinal LAD. Recommendation made to achieve tissue sampling via endobronchial ultrasound guided nodal biopsies and also lung biopsies. The risks, benefits, complications, treatment options and expected outcomes were discussed with the patient.  The possibilities of pneumothorax, pneumonia, reaction to medication, pulmonary aspiration, perforation of a viscus, bleeding, failure to diagnose a condition and creating a complication requiring transfusion or operation were discussed with the patient who freely signed the consent.    Description of Procedure: The patient was examined in the preoperative area and history and data from the preprocedure consultation were reviewed. It was deemed appropriate to proceed.  The patient was taken to OR 10, identified as Heaps and the procedure verified as Flexible Video Fiberoptic Bronchoscopy.  A Time Out was held and the above information confirmed. After being taken to the operating room general anesthesia was initiated and the patient  was orally intubated. The video fiberoptic bronchoscope was introduced via the endotracheal tube and a general inspection was performed which showed normal anatomy. Five endobronchial biopsies were taken from carina and right and left minor carina. BAL was performed in right upper lobe posterior sub segment. 180 cc instilled and return was 100cc cloudy fluid. 5  transbronchial biopsies were done in the right upper lobe.  The standard scope was then withdrawn and the endobronchial ultrasound was used to identify and characterize the peritracheal, hilar and bronchial lymph nodes. Inspection showed borderline enlargement of station 7, 4R, 10R and 10L nodes. Using real-time ultrasound guidance Wang needle biopsies were take from these nodes. At the end of the procedure a general airway inspection was performed and there was no evidence of active bleeding. The bronchoscope was removed.  The patient tolerated the procedure well. There was no significant blood loss and there were no obvious complications. A post-procedural chest x-ray is pending.  Samples: 1. Wang needle biopsies from 7 node 2. Wang needle biopsies from 4R node 3. Wang needle biopsies from 10R node 4. Wang needle biopsies from 10L node 5. Transbronchial forceps biopsies from right upper lobe posterior sub segment 6. Bronchoalveolar lavage from right upper lobe,  7. Endobronchial biopsies from R and L mainstem bronchus  Plans:  The patient will be discharged from the PACU to home when recovered from anesthesia and after chest x-ray is reviewed. We will review the cytology, pathology and microbiology results with the patient when they become available. Outpatient followup will be with Dr Katheran James MD [facility] Pager [phone] If no answer or after 3pm call: [phone] 09/13/2016, 10:24 AM

## 2016-09-13 NOTE — Progress Notes (Signed)
Report given to david rees rn as caregiver 

## 2016-09-13 NOTE — Anesthesia Postprocedure Evaluation (Signed)
Anesthesia Post Note  Patient: Brenda Walker  Procedure(s) Performed: Procedure(s) (LRB): VIDEO BRONCHOSCOPY WITH ENDOBRONCHIAL ULTRASOUND WITH BIOPSY (N/A)     Patient location during evaluation: PACU Anesthesia Type: General Level of consciousness: awake and alert Pain management: pain level controlled Vital Signs Assessment: post-procedure vital signs reviewed and stable Respiratory status: spontaneous breathing, nonlabored ventilation, respiratory function stable and patient connected to nasal cannula oxygen Cardiovascular status: blood pressure returned to baseline and stable Postop Assessment: no signs of nausea or vomiting Anesthetic complications: no    Last Vitals:  Vitals:   09/13/16 1300 09/13/16 1315  BP: 116/79 104/77  Pulse: (!) 112 (!) 107  Resp: (!) 26 (!) 24  Temp:      Last Pain:  Vitals:   09/13/16 1315  TempSrc:   PainSc: 0-No pain                 Tiajuana Amass
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
## 2016-09-13 NOTE — Transfer of Care (Signed)
Immediate Anesthesia Transfer of Care Note  Patient: Brenda Walker  Procedure(s) Performed: Procedure(s): VIDEO BRONCHOSCOPY WITH ENDOBRONCHIAL ULTRASOUND (N/A)  Patient Location: PACU  Anesthesia Type:General  Level of Consciousness: awake, alert , oriented and patient cooperative  Airway & Oxygen Therapy: Patient Spontanous Breathing and Patient connected to nasal cannula oxygen  Post-op Assessment: Report given to RN and Post -op Vital signs reviewed and stable  Post vital signs: Reviewed and stable  Last Vitals:  Vitals:   09/13/16 0655  BP: 136/78  Pulse: 100  Resp: 20  Temp: 36.8 C    Last Pain:  Vitals:   09/13/16 0655  TempSrc: Oral      Patients Stated Pain Goal: 3 (77/41/42 3953)  Complications: No apparent anesthesia complications

## 2016-09-13 NOTE — H&P (Signed)
Pt seen and examined in preop area.  Dx- Lung nodules, with mediastinal, hilar LNs  46 Y/O with incidental finding of lung nodules with mediastinal and hilar LNs  Stable since last assessment in office. Please see note from 5/31 for full details No new complaints  Blood pressure 136/78, pulse 100, temperature 98.2 F (36.8 C), temperature source Oral, resp. rate 20, height 5\' 3"  (1.6 m), weight 149 lb (67.6 kg), last menstrual period 08/16/2016, SpO2 100 %. Gen:      No acute distress HEENT:  EOMI, sclera anicteric Neck:     No masses; no thyromegaly Lungs:    Clear to auscultation bilaterally; normal respiratory effort CV:         Regular rate and rhythm; no murmurs Abd:      + bowel sounds; soft, non-tender; no palpable masses, no distension Ext:    No edema; adequate peripheral perfusion Skin:      Warm and dry; no rash Neuro: alert and oriented x 3 Psych: normal mood and affect   Labs and imaging reviewed  Assessment/Plan: Lung nodules with mediastinal, hilar LNs  Ok to proceed with bronchoscopy with EBUS Risk benefit discussed with patient and consent obtained.  Marshell Garfinkel MD [facility] Pager [phone] If no answer or after 3pm call: [phone] 09/13/2016, 8:05 AM

## 2016-09-13 NOTE — OR Nursing (Signed)
Dr. Vaughan Browner at bedside.  Discussed with patient the PCXR results.  NRB mask placed.  Pt will be admitted overnight for observation.  Pt educated regarding pneumothorax.  Dr. Vaughan Browner to write orders for observation and update pt's husband.  Pt denies SOB.  Only c/o of occasional dry cough.

## 2016-09-13 NOTE — Anesthesia Procedure Notes (Signed)
Procedure Name: Intubation Date/Time: 09/13/2016 8:38 AM Performed by: Shirlyn Goltz Pre-anesthesia Checklist: Patient identified, Emergency Drugs available, Suction available and Patient being monitored Patient Re-evaluated:Patient Re-evaluated prior to inductionOxygen Delivery Method: Circle system utilized Preoxygenation: Pre-oxygenation with 100% oxygen Intubation Type: IV induction Ventilation: Mask ventilation without difficulty Laryngoscope Size: Mac and 3 Grade View: Grade I Tube type: Oral Tube size: 8.5 mm Number of attempts: 1 Airway Equipment and Method: Stylet and LTA kit utilized Placement Confirmation: ETT inserted through vocal cords under direct vision,  positive ETCO2 and breath sounds checked- equal and bilateral Secured at: 21 cm Tube secured with: Tape Dental Injury: Teeth and Oropharynx as per pre-operative assessment

## 2016-09-14 ENCOUNTER — Encounter (HOSPITAL_COMMUNITY): Payer: Self-pay | Admitting: Pulmonary Disease

## 2016-09-14 ENCOUNTER — Inpatient Hospital Stay (HOSPITAL_COMMUNITY): Payer: Commercial Managed Care - PPO

## 2016-09-14 ENCOUNTER — Telehealth: Payer: Self-pay | Admitting: Pulmonary Disease

## 2016-09-14 DIAGNOSIS — R918 Other nonspecific abnormal finding of lung field: Secondary | ICD-10-CM | POA: Diagnosis not present

## 2016-09-14 DIAGNOSIS — J939 Pneumothorax, unspecified: Secondary | ICD-10-CM

## 2016-09-14 DIAGNOSIS — J9383 Other pneumothorax: Secondary | ICD-10-CM | POA: Diagnosis not present

## 2016-09-14 LAB — ACID FAST SMEAR (AFB, MYCOBACTERIA)
Acid Fast Smear: NEGATIVE
Acid Fast Smear: NEGATIVE

## 2016-09-14 LAB — BASIC METABOLIC PANEL
Anion gap: 7 (ref 5–15)
BUN: 8 mg/dL (ref 6–20)
CHLORIDE: 104 mmol/L (ref 101–111)
CO2: 25 mmol/L (ref 22–32)
CREATININE: 0.84 mg/dL (ref 0.44–1.00)
Calcium: 9.3 mg/dL (ref 8.9–10.3)
GFR calc non Af Amer: >60 mL/min (ref >60)
Glucose, Bld: 105 mg/dL — ABNORMAL HIGH (ref 65–99)
POTASSIUM: 4.1 mmol/L (ref 3.5–5.1)
Sodium: 136 mmol/L (ref 135–145)

## 2016-09-14 LAB — MAGNESIUM: Magnesium: 2 mg/dL (ref 1.7–2.4)

## 2016-09-14 LAB — ACID FAST SMEAR (AFB)

## 2016-09-14 LAB — PHOSPHORUS: PHOSPHORUS: 4.2 mg/dL (ref 2.5–4.6)

## 2016-09-14 MED ORDER — LIDOCAINE 1% INJECTION FOR CIRCUMCISION
20.0000 mL | INJECTION | Freq: Once | INTRAVENOUS | Status: DC
  Filled 2016-09-14: qty 20

## 2016-09-14 MED ORDER — IOPAMIDOL (ISOVUE-300) INJECTION 61%
INTRAVENOUS | Status: AC
  Filled 2016-09-14: qty 30
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
## 2016-09-14 NOTE — Progress Notes (Signed)
Meribeth Mattes to be D/C'd Home per MD order.  Discussed with the patient and all questions fully answered.  VSS, Skin clean, dry and intact without evidence of skin break down, no evidence of skin tears noted. IV catheter discontinued intact. Site without signs and symptoms of complications. Dressing and pressure applied.  An After Visit Summary was printed and given to the patient.  D/c education completed with patient/family including follow up instructions, medication list, d/c activities limitations if indicated, with other d/c instructions as indicated by MD - patient able to verbalize understanding, all questions fully answered.   Patient instructed to return to ED, call 911, or call MD for any changes in condition.   Patient escorted via [place], and D/C home via private auto.  Karolee Ohs 09/14/2016 3:51 PM

## 2016-09-15 LAB — CULTURE, RESPIRATORY: CULTURE: NORMAL

## 2016-09-15 LAB — CULTURE, RESPIRATORY W GRAM STAIN

## 2016-09-18 LAB — PNEUMOCYSTIS JIROVECI SMEAR BY DFA: Pneumocystis jiroveci Ag: NEGATIVE

## 2016-09-20 LAB — AEROBIC/ANAEROBIC CULTURE (SURGICAL/DEEP WOUND): CULTURE: NORMAL

## 2016-09-20 LAB — AEROBIC/ANAEROBIC CULTURE W GRAM STAIN (SURGICAL/DEEP WOUND): Culture: NORMAL

## 2016-09-20 LAB — ANAEROBIC CULTURE

## 2016-09-26 ENCOUNTER — Telehealth: Payer: Self-pay

## 2016-09-26 ENCOUNTER — Encounter: Payer: Commercial Managed Care - PPO | Admitting: Adult Health

## 2016-09-26 MED ORDER — LEVOFLOXACIN 750 MG PO TABS: 750.0000 mg | ORAL_TABLET | Freq: Every day | ORAL | 0 refills | Status: DC

## 2016-09-26 NOTE — Telephone Encounter (Signed)
Sent in [place] for pt per PM. See lab notes.

## 2016-09-26 NOTE — Progress Notes (Signed)
ATC pt, no answer. Left message for pt to call back.  

## 2016-09-28 ENCOUNTER — Telehealth: Payer: Self-pay | Admitting: Adult Health

## 2016-09-28 NOTE — Telephone Encounter (Signed)
Spoke with patient and asked that she arrive at 3:00 for 3:30 appointment on 09/29/16 to have chest xray performed; she was instructed to check in first and they will send her down to xray. She verbalized understanding. Nothing further is needed.

## 2016-09-29 ENCOUNTER — Ambulatory Visit (INDEPENDENT_AMBULATORY_CARE_PROVIDER_SITE_OTHER): Payer: Commercial Managed Care - PPO | Admitting: Adult Health

## 2016-09-29 ENCOUNTER — Ambulatory Visit (INDEPENDENT_AMBULATORY_CARE_PROVIDER_SITE_OTHER)
Admission: RE | Admit: 2016-09-29 | Discharge: 2016-09-29 | Disposition: A | Discharge status: Home / Self Care | Payer: Commercial Managed Care - PPO | Source: Ambulatory Visit | Attending: Adult Health | Admitting: Adult Health

## 2016-09-29 ENCOUNTER — Encounter: Payer: Self-pay | Admitting: Adult Health

## 2016-09-29 VITALS — BP 116/76 | HR 91 | Ht 63.0 in | Wt 146.4 lb

## 2016-09-29 DIAGNOSIS — D869 Sarcoidosis, unspecified: Secondary | ICD-10-CM | POA: Insufficient documentation

## 2016-09-29 DIAGNOSIS — J939 Pneumothorax, unspecified: Secondary | ICD-10-CM | POA: Diagnosis not present

## 2016-09-29 DIAGNOSIS — J9383 Other pneumothorax: Secondary | ICD-10-CM

## 2016-09-29 DIAGNOSIS — R938 Abnormal findings on diagnostic imaging of other specified body structures: Secondary | ICD-10-CM

## 2016-09-29 DIAGNOSIS — R9389 Abnormal findings on diagnostic imaging of other specified body structures: Secondary | ICD-10-CM

## 2016-09-29 MED ORDER — PREDNISONE 10 MG PO TABS: ORAL_TABLET | ORAL | 1 refills | Status: DC
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
## 2016-09-29 NOTE — Assessment & Plan Note (Signed)
Lung nodularity with bilateral hilar adenopathy. Pathology w/ small nonnecrotizing granuloma consistent with sarcoidosis. She'll begin a trial of prednisone.  Steroid patient education given   Plan  Patient Instructions  Begin Prednisone 40mg  daily for 4 days then 30mg  daily for 4 days then 20mg  daily and hold at this dose.  Yearly eye exams .  Follow up with Dr. Vaughan Browner in 4 weeks with PFT and As needed   Please contact office for sooner follow up if symptoms do not improve or worsen or seek emergency care

## 2016-09-29 NOTE — Patient Instructions (Addendum)
Begin Prednisone 40mg  daily for 4 days then 30mg  daily for 4 days then 20mg  daily and hold at this dose.  Yearly eye exams .  Follow up with Dr. Vaughan Browner in 4 weeks with PFT and As needed   Please contact office for sooner follow up if symptoms do not improve or worsen or seek emergency care

## 2016-09-29 NOTE — Assessment & Plan Note (Signed)
Path c/w sarcoid  BAL w/ prevotella - finish abx Begin steroids - Will need PFT on reutrn   Plan  Patient Instructions  Begin Prednisone 44m daily for 4 days then 374mdaily for 4 days then 20108maily and hold at this dose.  Yearly eye exams .  Follow up with Dr. ManVaughan Browner 4 weeks with PFT and As needed   Please contact office for sooner follow up if symptoms do not improve or worsen or seek emergency care

## 2016-09-29 NOTE — Addendum Note (Signed)
Addended by: Parke Poisson E on: 09/29/2016 04:52 PM   Modules accepted: Orders

## 2016-09-29 NOTE — Assessment & Plan Note (Signed)
Resolved on Cxr today .

## 2016-09-29 NOTE — Addendum Note (Signed)
Addended by: Parke Poisson E on: 09/29/2016 04:40 PM   Modules accepted: Orders

## 2016-10-04 ENCOUNTER — Telehealth: Payer: Self-pay | Admitting: Adult Health

## 2016-10-04 NOTE — Telephone Encounter (Signed)
Spoke with patient. She wanted to know if she could take Tylenol with prednisone. Advised patient that that was safe to do. She verbalized understanding. Nothing further needed at time of call.

## 2016-10-12 LAB — FUNGUS CULTURE WITH STAIN

## 2016-10-12 LAB — FUNGUS CULTURE RESULT

## 2016-10-12 LAB — FUNGAL ORGANISM REFLEX

## 2016-10-13 LAB — FUNGUS CULTURE WITH STAIN

## 2016-10-13 LAB — FUNGUS CULTURE RESULT

## 2016-10-13 LAB — FUNGAL ORGANISM REFLEX

## 2016-10-27 LAB — ACID FAST CULTURE WITH REFLEXED SENSITIVITIES (MYCOBACTERIA)

## 2016-10-27 LAB — ACID FAST CULTURE WITH REFLEXED SENSITIVITIES
ACID FAST CULTURE - AFSCU3: NEGATIVE
ACID FAST CULTURE - AFSCU3: NEGATIVE

## 2016-11-07 ENCOUNTER — Encounter: Payer: Self-pay | Admitting: Pulmonary Disease

## 2016-11-07 ENCOUNTER — Ambulatory Visit (INDEPENDENT_AMBULATORY_CARE_PROVIDER_SITE_OTHER): Payer: Commercial Managed Care - PPO | Admitting: Pulmonary Disease

## 2016-11-07 ENCOUNTER — Ambulatory Visit (INDEPENDENT_AMBULATORY_CARE_PROVIDER_SITE_OTHER)
Admission: RE | Admit: 2016-11-07 | Discharge: 2016-11-07 | Disposition: A | Discharge status: Home / Self Care | Payer: Commercial Managed Care - PPO | Source: Ambulatory Visit | Attending: Pulmonary Disease | Admitting: Pulmonary Disease

## 2016-11-07 VITALS — BP 122/70 | HR 122 | Ht 63.0 in | Wt 150.0 lb

## 2016-11-07 DIAGNOSIS — J939 Pneumothorax, unspecified: Secondary | ICD-10-CM

## 2016-11-07 DIAGNOSIS — R0602 Shortness of breath: Secondary | ICD-10-CM | POA: Diagnosis not present

## 2016-11-07 DIAGNOSIS — D869 Sarcoidosis, unspecified: Secondary | ICD-10-CM

## 2016-11-07 LAB — PULMONARY FUNCTION TEST
DL/VA % pred: 141 %
DL/VA: 6.63 ml/min/mmHg/L
DLCO cor % pred: 109 %
DLCO cor: 24.99 ml/min/mmHg
DLCO unc % pred: 116 %
DLCO unc: 26.78 ml/min/mmHg
FEF 25-75 Post: 2 L/sec
FEF 25-75 Pre: 1.91 L/sec
FEF2575-%CHANGE-POST: 4 %
FEF2575-%PRED-POST: 72 %
FEF2575-%Pred-Pre: 69 %
FEV1-%Change-Post: 0 %
FEV1-%Pred-Post: 79 %
FEV1-%Pred-Pre: 79 %
FEV1-PRE: 2.2 L
FEV1-Post: 2.19 L
FEV1FVC-%CHANGE-POST: 0 %
FEV1FVC-%Pred-Pre: 97 %
FEV6-%Change-Post: 0 %
FEV6-%PRED-POST: 81 %
FEV6-%PRED-PRE: 82 %
FEV6-POST: 2.75 L
FEV6-Pre: 2.78 L
FEV6FVC-%PRED-POST: 103 %
FEV6FVC-%Pred-Pre: 103 %
FVC-%CHANGE-POST: 0 %
FVC-%PRED-PRE: 80 %
FVC-%Pred-Post: 79 %
FVC-POST: 2.75 L
FVC-PRE: 2.78 L
POST FEV1/FVC RATIO: 79 %
PRE FEV1/FVC RATIO: 79 %
PRE FEV6/FVC RATIO: 100 %
Post FEV6/FVC ratio: 100 %
RV % PRED: 109 %
RV: 1.87 L
TLC % pred: 93 %
TLC: 4.59 L

## 2016-11-07 LAB — NITRIC OXIDE: Nitric Oxide: 25

## 2016-11-07 MED ORDER — FLUTICASONE FUROATE-VILANTEROL 100-25 MCG/INH IN AEPB: 1.0000 | INHALATION_SPRAY | Freq: Every day | RESPIRATORY_TRACT | 0 refills | Status: AC

## 2016-11-07 MED ORDER — ALBUTEROL SULFATE HFA 108 (90 BASE) MCG/ACT IN AERS: 2.0000 | INHALATION_SPRAY | Freq: Four times a day (QID) | RESPIRATORY_TRACT | 2 refills | Status: DC | PRN

## 2016-11-07 MED ORDER — FLUTICASONE FUROATE-VILANTEROL 100-25 MCG/INH IN AEPB: 1.0000 | INHALATION_SPRAY | Freq: Every day | RESPIRATORY_TRACT | 6 refills | Status: DC
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
## 2016-11-07 NOTE — Progress Notes (Signed)
PFT done today. 

## 2016-11-07 NOTE — Patient Instructions (Addendum)
Continue prednisone at 20 mg We'll get a chest x-ray today We'll check FENO Will start breo 100  Follow-up in 3 months.

## 2016-11-14 ENCOUNTER — Telehealth: Payer: Self-pay

## 2016-11-14 MED ORDER — PREDNISONE 5 MG PO TABS: ORAL_TABLET | ORAL | 0 refills | Status: DC

## 2016-11-14 NOTE — Telephone Encounter (Signed)
Pt is aware of results/recommendations. Rx for 5mg  prednisone has been sent to preferred pharmacy. Nothing further needed.

## 2016-11-14 NOTE — Telephone Encounter (Signed)
-----   Message from Marshell Garfinkel, MD sent at 11/10/2016  4:23 PM EDT ----- Can you call and let Ms Diss know I reviewed her CXR from this weak. It shows improving lung nodules. Ask her to reduce prednisone dose to 15 mg for 2 weeks and then 10mg . Hold at this dose till she sees me again. I think you may have to call in a new prescription for 5 mg tabs of prednisone. Thanks [employer]

## 2016-11-20 ENCOUNTER — Other Ambulatory Visit: Payer: Self-pay | Admitting: Family

## 2016-11-20 DIAGNOSIS — R519 Headache, unspecified: Secondary | ICD-10-CM

## 2016-11-20 DIAGNOSIS — G44009 Cluster headache syndrome, unspecified, not intractable: Secondary | ICD-10-CM

## 2016-11-20 DIAGNOSIS — R51 Headache: Principal | ICD-10-CM

## 2016-11-21 NOTE — Telephone Encounter (Signed)
Last seen 04/26/16  [facility]

## 2017-02-13 ENCOUNTER — Encounter: Payer: Self-pay | Admitting: Pulmonary Disease

## 2017-02-13 ENCOUNTER — Ambulatory Visit: Payer: Commercial Managed Care - PPO | Admitting: Pulmonary Disease

## 2017-02-13 VITALS — BP 132/72 | HR 104 | Ht 63.0 in | Wt 149.1 lb

## 2017-02-13 DIAGNOSIS — D869 Sarcoidosis, unspecified: Secondary | ICD-10-CM | POA: Diagnosis not present

## 2017-02-13 DIAGNOSIS — R51 Headache: Secondary | ICD-10-CM | POA: Diagnosis not present

## 2017-02-13 DIAGNOSIS — R519 Headache, unspecified: Secondary | ICD-10-CM

## 2017-02-13 NOTE — Patient Instructions (Signed)
I am glad that your breathing is doing well We will continue to reduce the prednisone.  Reduce dose by 2.5 mg every 2 weeks until completely off the prednisone Use calcium and vitamin D over-the-counter supplementation while you are on the prednisone We will refer you to an eye doctor for examination of the eye Follow-up in 3 months.
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
## 2017-02-16 ENCOUNTER — Other Ambulatory Visit: Payer: Self-pay | Admitting: Family

## 2017-02-16 ENCOUNTER — Telehealth: Payer: Self-pay | Admitting: Pulmonary Disease

## 2017-02-16 DIAGNOSIS — R519 Headache, unspecified: Secondary | ICD-10-CM

## 2017-02-16 DIAGNOSIS — R51 Headache: Principal | ICD-10-CM

## 2017-02-16 DIAGNOSIS — G44009 Cluster headache syndrome, unspecified, not intractable: Secondary | ICD-10-CM

## 2017-02-16 MED ORDER — PREDNISONE 5 MG PO TABS: ORAL_TABLET | ORAL | 0 refills | Status: DC

## 2017-02-16 NOTE — Telephone Encounter (Signed)
Rx for pred was refilled  Spoke with the pt and notified her that this was done

## 2017-03-13 ENCOUNTER — Other Ambulatory Visit: Payer: Self-pay | Admitting: Family

## 2017-03-13 DIAGNOSIS — R519 Headache, unspecified: Secondary | ICD-10-CM

## 2017-03-13 DIAGNOSIS — R51 Headache: Principal | ICD-10-CM

## 2017-03-13 DIAGNOSIS — G44009 Cluster headache syndrome, unspecified, not intractable: Secondary | ICD-10-CM

## 2017-03-17 ENCOUNTER — Other Ambulatory Visit: Payer: Self-pay | Admitting: Pulmonary Disease

## 2017-03-23 ENCOUNTER — Ambulatory Visit: Payer: Commercial Managed Care - PPO | Admitting: Physician Assistant

## 2017-03-23 ENCOUNTER — Encounter: Payer: Self-pay | Admitting: Physician Assistant

## 2017-03-23 VITALS — BP 132/85 | HR 100 | Temp 97.7°F | Ht 63.0 in | Wt 149.0 lb

## 2017-03-23 DIAGNOSIS — J301 Allergic rhinitis due to pollen: Secondary | ICD-10-CM | POA: Diagnosis not present

## 2017-03-23 DIAGNOSIS — J324 Chronic pansinusitis: Secondary | ICD-10-CM | POA: Diagnosis not present

## 2017-03-23 MED ORDER — FLUTICASONE PROPIONATE 50 MCG/ACT NA SUSP: 2.0000 | Freq: Every day | NASAL | 11 refills | Status: DC

## 2017-03-23 MED ORDER — LEVOFLOXACIN 500 MG PO TABS: 500.0000 mg | ORAL_TABLET | Freq: Every day | ORAL | 0 refills | Status: DC

## 2017-03-23 NOTE — Patient Instructions (Signed)
In a few days you may receive a survey in the mail or online from [employer] regarding your visit with us today. Please take a moment to fill this out. Your feedback is very important to our whole office. It can help us better understand your needs as well as improve your experience and satisfaction. Thank you for taking your time to complete it. We care about you.  Dorris Vangorder, PA-C  
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
## 2017-03-26 ENCOUNTER — Other Ambulatory Visit: Payer: Self-pay | Admitting: Family Medicine

## 2017-03-26 DIAGNOSIS — Z1231 Encounter for screening mammogram for malignant neoplasm of breast: Secondary | ICD-10-CM

## 2017-04-10 ENCOUNTER — Other Ambulatory Visit: Payer: Self-pay | Admitting: Family

## 2017-04-10 DIAGNOSIS — Z1231 Encounter for screening mammogram for malignant neoplasm of breast: Secondary | ICD-10-CM

## 2017-04-11 ENCOUNTER — Other Ambulatory Visit: Payer: Self-pay | Admitting: Family

## 2017-04-11 DIAGNOSIS — R519 Headache, unspecified: Secondary | ICD-10-CM

## 2017-04-11 DIAGNOSIS — R51 Headache: Principal | ICD-10-CM

## 2017-04-11 DIAGNOSIS — G44009 Cluster headache syndrome, unspecified, not intractable: Secondary | ICD-10-CM

## 2017-04-12 ENCOUNTER — Other Ambulatory Visit: Payer: Commercial Managed Care - PPO | Admitting: Family

## 2017-04-16 ENCOUNTER — Encounter: Payer: Self-pay | Admitting: Family

## 2017-04-16 ENCOUNTER — Ambulatory Visit (INDEPENDENT_AMBULATORY_CARE_PROVIDER_SITE_OTHER): Payer: Commercial Managed Care - PPO | Admitting: Family

## 2017-04-16 VITALS — BP 132/92 | HR 99 | Temp 97.3°F | Ht 63.0 in | Wt 148.6 lb

## 2017-04-16 DIAGNOSIS — R519 Headache, unspecified: Secondary | ICD-10-CM

## 2017-04-16 DIAGNOSIS — Z01419 Encounter for gynecological examination (general) (routine) without abnormal findings: Secondary | ICD-10-CM

## 2017-04-16 DIAGNOSIS — B373 Candidiasis of vulva and vagina: Secondary | ICD-10-CM

## 2017-04-16 DIAGNOSIS — Z0000 Encounter for general adult medical examination without abnormal findings: Secondary | ICD-10-CM

## 2017-04-16 DIAGNOSIS — Z1231 Encounter for screening mammogram for malignant neoplasm of breast: Secondary | ICD-10-CM

## 2017-04-16 DIAGNOSIS — G44009 Cluster headache syndrome, unspecified, not intractable: Secondary | ICD-10-CM

## 2017-04-16 DIAGNOSIS — R51 Headache: Secondary | ICD-10-CM

## 2017-04-16 DIAGNOSIS — E663 Overweight: Secondary | ICD-10-CM | POA: Insufficient documentation

## 2017-04-16 DIAGNOSIS — B3731 Acute candidiasis of vulva and vagina: Secondary | ICD-10-CM

## 2017-04-16 MED ORDER — FLUCONAZOLE 150 MG PO TABS: 150.0000 mg | ORAL_TABLET | ORAL | 0 refills | Status: DC | PRN

## 2017-04-16 MED ORDER — NORGESTIM-ETH ESTRAD TRIPHASIC 0.18/0.215/0.25 MG-35 MCG PO TABS: 1.0000 | ORAL_TABLET | Freq: Every day | ORAL | 1 refills | Status: DC

## 2017-04-16 NOTE — Addendum Note (Signed)
Addended by: Evelina Dun A on: 04/16/2017 10:59 AM   Modules accepted: Orders

## 2017-04-16 NOTE — Progress Notes (Signed)
   Subjective:    Patient ID: BLEN RANSOME, female    DOB: [birth year], 47 y.o.   MRN: [id number]  Patient is here today for CPE with pap. Patient reports having chronic sinus issues and has ENT appt this week. Pt is followed by Pulmonologists every 3 months for Sarcoidosis. PT currently taking Breo daily and doing well.  Gynecologic Exam  The patient's primary symptoms include genital itching. The patient's pertinent negatives include no missed menses or vaginal discharge. The current episode started in the past 7 days. The problem occurs intermittently. The problem has been waxing and waning. The pain is mild.      Review of Systems  Genitourinary: Negative for missed menses and vaginal discharge.  All other systems reviewed and are negative.      Objective:   Physical Exam  Constitutional: She is oriented to person, place, and time. She appears well-developed and well-nourished. No distress.  HENT:  Head: Normocephalic and atraumatic.  Right Ear: External ear normal.  Left Ear: External ear normal. Tympanic membrane is bulging.  Nose: Mucosal edema present.  Mouth/Throat: Posterior oropharyngeal erythema present.  Eyes: Pupils are equal, round, and reactive to light.  Neck: Normal range of motion. Neck supple. No thyromegaly present.  Cardiovascular: Normal rate, regular rhythm, normal heart sounds and intact distal pulses.  No murmur heard. Pulmonary/Chest: Effort normal and breath sounds normal. No respiratory distress. She has no wheezes. Right breast exhibits no inverted nipple, no mass, no nipple discharge, no skin change and no tenderness. Left breast exhibits no inverted nipple, no mass, no nipple discharge and no skin change. Breasts are symmetrical.  Abdominal: Soft. Bowel sounds are normal. She exhibits no distension. There is no tenderness.  Genitourinary: Vagina normal. There is rash (erythemas) on the right labia. There is rash (erytheams) on the left labia.    Genitourinary Comments: Bimanual exam- no adnexal masses or tenderness, ovaries nonpalpable   Cervix parous and pink- No discharge   Musculoskeletal: Normal range of motion. She exhibits no edema or tenderness.  Neurological: She is alert and oriented to person, place, and time.  Skin: Skin is warm and dry.  Psychiatric: She has a normal mood and affect. Her behavior is normal. Judgment and thought content normal.  Vitals reviewed.     BP (!) 132/92   Pulse 99   Temp (!) 97.3 F (36.3 C) (Oral)   Ht _0  (1.6 m)   Wt 148 lb 9.6 oz (67.4 kg)   LMP 03/16/2017   BMI 26.32 kg/m      Assessment & Plan:  1. Annual physical exam - CMP14+EGFR - Anemia Profile B - Lipid panel - VITAMIN D 25 Hydroxy (Vit-D Deficiency, Fractures) - TSH - Pap IG w/ reflex to HPV when ASC-U  2. Visit for screening mammogram - MM SCREENING BREAST TOMO BILATERAL - CMP14+EGFR - Pap IG w/ reflex to HPV when ASC-U  3. Overweight (BMI 25.0-29.9) - CMP14+EGFR  4. Encounter for gynecological examination without abnormal finding - CMP14+EGFR  5. Vagina, candidiasis Keep clean and dry Daily yogurt - CMP14+EGFR - fluconazole (DIFLUCAN) 150 MG tablet; Take 1 tablet (150 mg total) by mouth every three (3) days as needed.  Dispense: 3 tablet; Refill: 0   Continue all meds Labs pending Health Maintenance reviewed Diet and exercise encouraged RTO 1 year and keep appt with ENT and Pulmonologists    Evelina Dun, FNP

## 2017-04-16 NOTE — Patient Instructions (Signed)

## 2017-04-17 ENCOUNTER — Ambulatory Visit
Admission: RE | Admit: 2017-04-17 | Discharge: 2017-04-17 | Disposition: A | Discharge status: Home / Self Care | Payer: Commercial Managed Care - PPO | Source: Ambulatory Visit | Attending: Family | Admitting: Family

## 2017-04-17 ENCOUNTER — Ambulatory Visit: Payer: Commercial Managed Care - PPO

## 2017-04-17 DIAGNOSIS — Z1231 Encounter for screening mammogram for malignant neoplasm of breast: Secondary | ICD-10-CM

## 2017-04-17 LAB — CMP14+EGFR
A/G RATIO: 1.4 (ref 1.2–2.2)
ALT: 12 IU/L (ref 0–32)
AST: 16 IU/L (ref 0–40)
Albumin: 3.9 g/dL (ref 3.5–5.5)
Alkaline Phosphatase: 83 IU/L (ref 39–117)
BUN/Creatinine Ratio: 10 (ref 9–23)
BUN: 9 mg/dL (ref 6–24)
Bilirubin Total: 0.2 mg/dL (ref 0.0–1.2)
CALCIUM: 9.4 mg/dL (ref 8.7–10.2)
CO2: 23 mmol/L (ref 20–29)
CREATININE: 0.91 mg/dL (ref 0.57–1.00)
Chloride: 104 mmol/L (ref 96–106)
GFR, EST AFRICAN AMERICAN: 86 mL/min/{1.73_m2} (ref >59)
GFR, EST NON AFRICAN AMERICAN: 75 mL/min/{1.73_m2} (ref >59)
GLOBULIN, TOTAL: 2.7 g/dL (ref 1.5–4.5)
Glucose: 83 mg/dL (ref 65–99)
POTASSIUM: 4.7 mmol/L (ref 3.5–5.2)
Sodium: 141 mmol/L (ref 134–144)
TOTAL PROTEIN: 6.6 g/dL (ref 6.0–8.5)

## 2017-04-17 LAB — ANEMIA PROFILE B
BASOS: 0 %
Basophils Absolute: 0 10*3/uL (ref 0.0–0.2)
EOS (ABSOLUTE): 0 10*3/uL (ref 0.0–0.4)
EOS: 1 %
FERRITIN: 36 ng/mL (ref 15–150)
FOLATE: 7.9 ng/mL (ref >3.0)
HEMATOCRIT: 37.5 % (ref 34.0–46.6)
HEMOGLOBIN: 12.4 g/dL (ref 11.1–15.9)
IRON SATURATION: 31 % (ref 15–55)
Immature Grans (Abs): 0 10*3/uL (ref 0.0–0.1)
Immature Granulocytes: 0 %
Iron: 109 ug/dL (ref 27–159)
LYMPHS ABS: 0.7 10*3/uL (ref 0.7–3.1)
LYMPHS: 9 %
MCH: 30.5 pg (ref 26.6–33.0)
MCHC: 33.1 g/dL (ref 31.5–35.7)
MCV: 92 fL (ref 79–97)
MONOS ABS: 0.4 10*3/uL (ref 0.1–0.9)
Monocytes: 5 %
NEUTROS ABS: 6.4 10*3/uL (ref 1.4–7.0)
Neutrophils: 85 %
Platelets: 337 10*3/uL (ref 150–379)
RBC: 4.07 x10E6/uL (ref 3.77–5.28)
RDW: 13 % (ref 12.3–15.4)
Retic Ct Pct: 0.9 % (ref 0.6–2.6)
Total Iron Binding Capacity: 347 ug/dL (ref 250–450)
UIBC: 238 ug/dL (ref 131–425)
VITAMIN B 12: 370 pg/mL (ref 232–1245)
WBC: 7.5 10*3/uL (ref 3.4–10.8)

## 2017-04-17 LAB — LIPID PANEL
CHOL/HDL RATIO: 2.8 ratio (ref 0.0–4.4)
Cholesterol, Total: 182 mg/dL (ref 100–199)
HDL: 65 mg/dL (ref >39)
LDL Calculated: 98 mg/dL (ref 0–99)
Triglycerides: 94 mg/dL (ref 0–149)
VLDL Cholesterol Cal: 19 mg/dL (ref 5–40)

## 2017-04-17 LAB — PAP IG W/ RFLX HPV ASCU: PAP Smear Comment: 0

## 2017-04-17 LAB — VITAMIN D 25 HYDROXY (VIT D DEFICIENCY, FRACTURES): VIT D 25 HYDROXY: 32.5 ng/mL (ref 30.0–100.0)

## 2017-04-17 LAB — TSH: TSH: 1.67 u[IU]/mL (ref 0.450–4.500)

## 2017-04-23 ENCOUNTER — Telehealth: Payer: Self-pay | Admitting: Family

## 2017-04-23 ENCOUNTER — Other Ambulatory Visit (INDEPENDENT_AMBULATORY_CARE_PROVIDER_SITE_OTHER): Payer: Self-pay | Admitting: Otolaryngology

## 2017-04-23 DIAGNOSIS — J328 Other chronic sinusitis: Secondary | ICD-10-CM

## 2017-04-23 NOTE — Telephone Encounter (Signed)
Patient aware.

## 2017-04-23 NOTE — Telephone Encounter (Signed)
This is fine to take because the diflucan is once and then only 3 days later if she is still having symptoms to take another. Patient should not be taking albuterol daily either.

## 2017-04-23 NOTE — Telephone Encounter (Signed)
Pt was prescribed fluconazole (DIFLUCAN) 150 MG tablet last Friday and the pt is also on albuterol (PROVENTIL HFA;VENTOLIN HFA) 108 (90 Base) MCG/ACT inhaler she states that she read that taking the two together can cause heart arrhthymias and she has not started taking the diflucan yet because of that. She is wanting to talk to a nurse to see what she should do

## 2017-04-26 ENCOUNTER — Ambulatory Visit
Admission: RE | Admit: 2017-04-26 | Discharge: 2017-04-26 | Disposition: A | Discharge status: Home / Self Care | Payer: Commercial Managed Care - PPO | Source: Ambulatory Visit | Attending: Otolaryngology | Admitting: Otolaryngology

## 2017-04-26 DIAGNOSIS — J328 Other chronic sinusitis: Secondary | ICD-10-CM

## 2017-05-17 ENCOUNTER — Encounter: Payer: Self-pay | Admitting: Family

## 2017-05-17 ENCOUNTER — Ambulatory Visit: Payer: Commercial Managed Care - PPO | Admitting: Family

## 2017-05-17 VITALS — BP 141/97 | HR 132 | Temp 98.1°F | Ht 63.0 in | Wt 138.0 lb

## 2017-05-17 DIAGNOSIS — F411 Generalized anxiety disorder: Secondary | ICD-10-CM | POA: Diagnosis not present

## 2017-05-17 MED ORDER — ESCITALOPRAM OXALATE 10 MG PO TABS: 10.0000 mg | ORAL_TABLET | Freq: Every day | ORAL | 3 refills | Status: DC

## 2017-05-17 NOTE — Progress Notes (Signed)
   Subjective:    Patient ID: Brenda Walker, female    DOB: 1971/02/02, 47 y.o.   MRN: [id number]  PT presents to the office today for recurrent GAD. PT has been on lexapro in the past and over the last year she weaned herself off and feels like she needs to restart this.  Anxiety  Presents for follow-up visit. Symptoms include decreased concentration, excessive worry, irritability, nervous/anxious behavior, panic and restlessness. Symptoms occur most days. The severity of symptoms is moderate. The quality of sleep is fair.        Review of Systems  Constitutional: Positive for irritability.  Psychiatric/Behavioral: Positive for decreased concentration. The patient is nervous/anxious.   All other systems reviewed and are negative.      Objective:   Physical Exam  Constitutional: She is oriented to person, place, and time. She appears well-developed and well-nourished. No distress.  HENT:  Head: Normocephalic and atraumatic.  Right Ear: External ear normal.  Left Ear: External ear normal.  Nose: Nose normal.  Mouth/Throat: Oropharynx is clear and moist.  Eyes: Pupils are equal, round, and reactive to light.  Neck: Normal range of motion. Neck supple. No thyromegaly present.  Cardiovascular: Normal rate, regular rhythm, normal heart sounds and intact distal pulses.  No murmur heard. Pulmonary/Chest: Effort normal and breath sounds normal. No respiratory distress. She has no wheezes.  Abdominal: Soft. Bowel sounds are normal. She exhibits no distension. There is no tenderness.  Musculoskeletal: Normal range of motion. She exhibits no edema or tenderness.  Neurological: She is alert and oriented to person, place, and time.  Skin: Skin is warm and dry.  Psychiatric: She has a normal mood and affect. Her behavior is normal. Judgment and thought content normal.  Vitals reviewed.   BP (!) 141/97   Pulse (!) 132   Temp 98.1 F (36.7 C)   Ht 5\' 3"  (1.6 m)   Wt 138 lb (62.6 kg)    BMI 24.45 kg/m        Assessment & Plan:  1. Generalized anxiety disorder We will restart Lexapro 10 mg today Stress management discussed RTO in 6 weeks to recheck  - escitalopram (LEXAPRO) 10 MG tablet; Take 1 tablet (10 mg total) by mouth daily.  Dispense: 90 tablet; Refill: [place], FNP

## 2017-05-17 NOTE — Patient Instructions (Signed)

## 2017-05-18 ENCOUNTER — Telehealth: Payer: Self-pay | Admitting: Family

## 2017-05-18 ENCOUNTER — Ambulatory Visit: Payer: Commercial Managed Care - PPO | Admitting: Family

## 2017-05-18 MED ORDER — HYDROXYZINE PAMOATE 25 MG PO CAPS: 25.0000 mg | ORAL_CAPSULE | Freq: Three times a day (TID) | ORAL | 1 refills | Status: DC | PRN

## 2017-05-18 NOTE — Telephone Encounter (Signed)
Continue Lexapro, may need to adjust it morning so it will not effect her sleep. Will send in Vistaril rx to take as needed.

## 2017-05-18 NOTE — Telephone Encounter (Signed)
Patient seen you yesterday- please advise

## 2017-05-18 NOTE — Telephone Encounter (Signed)
Patient aware and verbalizes understanding. 

## 2017-05-19 ENCOUNTER — Telehealth: Payer: Self-pay | Admitting: Family

## 2017-05-19 NOTE — Telephone Encounter (Signed)
Patient states that she has taken lexapro for 2 days and every time she has taken this medication she has had an elevated HR.  Informed patient to stop taking for now.

## 2017-05-19 NOTE — Telephone Encounter (Signed)
Yes, stop for now and pass on to the PCP for next step.

## 2017-05-20 ENCOUNTER — Emergency Department (HOSPITAL_COMMUNITY): Payer: Commercial Managed Care - PPO

## 2017-05-20 ENCOUNTER — Encounter (HOSPITAL_COMMUNITY): Payer: Self-pay | Admitting: Emergency Medicine

## 2017-05-20 ENCOUNTER — Emergency Department (HOSPITAL_COMMUNITY)
Admission: EM | Admit: 2017-05-20 | Discharge: 2017-05-20 | Disposition: A | Discharge status: Home / Self Care | Payer: Commercial Managed Care - PPO | Attending: Emergency Medicine | Admitting: Emergency Medicine

## 2017-05-20 DIAGNOSIS — R002 Palpitations: Secondary | ICD-10-CM | POA: Diagnosis not present

## 2017-05-20 DIAGNOSIS — F419 Anxiety disorder, unspecified: Secondary | ICD-10-CM | POA: Diagnosis present

## 2017-05-20 DIAGNOSIS — Z79899 Other long term (current) drug therapy: Secondary | ICD-10-CM | POA: Insufficient documentation

## 2017-05-20 LAB — BASIC METABOLIC PANEL
Anion gap: 12 (ref 5–15)
BUN: 8 mg/dL (ref 6–20)
CALCIUM: 9.5 mg/dL (ref 8.9–10.3)
CO2: 22 mmol/L (ref 22–32)
CREATININE: 0.83 mg/dL (ref 0.44–1.00)
Chloride: 95 mmol/L — ABNORMAL LOW (ref 101–111)
Glucose, Bld: 113 mg/dL — ABNORMAL HIGH (ref 65–99)
Potassium: 4.4 mmol/L (ref 3.5–5.1)
SODIUM: 129 mmol/L — AB (ref 135–145)

## 2017-05-20 LAB — CBC
HEMATOCRIT: 42.4 % (ref 36.0–46.0)
Hemoglobin: 14.2 g/dL (ref 12.0–15.0)
MCH: 30.5 pg (ref 26.0–34.0)
MCHC: 33.5 g/dL (ref 30.0–36.0)
MCV: 91 fL (ref 78.0–100.0)
PLATELETS: 353 10*3/uL (ref 150–400)
RBC: 4.66 MIL/uL (ref 3.87–5.11)
RDW: 13.3 % (ref 11.5–15.5)
WBC: 7.4 10*3/uL (ref 4.0–10.5)

## 2017-05-20 MED ORDER — LORAZEPAM 1 MG PO TABS
1.0000 mg | ORAL_TABLET | Freq: Once | ORAL | Status: AC
  Administered 2017-05-20: 1 mg via ORAL
  Filled 2017-05-20: qty 1

## 2017-05-20 MED ORDER — HYDROXYZINE HCL 25 MG PO TABS: 25.0000 mg | ORAL_TABLET | Freq: Three times a day (TID) | ORAL | 0 refills | Status: DC | PRN

## 2017-05-20 NOTE — ED Triage Notes (Signed)
Pt to ER for evaluation of worsening anxiety and feeling like her heart is racing with palpitations. States she was recently put back on lexapro and "immediately after she takes the medication her heart starts racing." anxious in triage, but calm and cooperative. A/o x4.

## 2017-05-20 NOTE — Discharge Instructions (Addendum)
It was our pleasure to provide your ER care today - we hope that you feel better.  From today's labs, your sodium level is mildly low (129) - eat balanced diet, and follow up with primary care doctor in the next couple weeks.   Also follow up with therapist/counselor in the next 1-2 weeks - see resource guide.   Return to ER if worse, new symptoms, fevers, trouble breathing, other concern.  You may try taking vistaril as need for anxiety episodes - may make drowsy, no driving when taking.   You were given medication in the ER that can cause drowsiness - no driving for the next 4 hours.

## 2017-05-20 NOTE — ED Notes (Signed)
Pt transported to Xray. 
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
## 2017-05-21 ENCOUNTER — Ambulatory Visit: Payer: Commercial Managed Care - PPO | Admitting: Pulmonary Disease

## 2017-05-21 ENCOUNTER — Encounter: Payer: Self-pay | Admitting: Pulmonary Disease

## 2017-05-21 VITALS — BP 130/86 | HR 106 | Ht 63.0 in | Wt 138.2 lb

## 2017-05-21 DIAGNOSIS — D869 Sarcoidosis, unspecified: Secondary | ICD-10-CM | POA: Diagnosis not present

## 2017-05-21 LAB — NITRIC OXIDE: Nitric Oxide: 25

## 2017-05-21 NOTE — Telephone Encounter (Signed)
Pt can try taking half a lexapro at bedtime to see if this helps with her increase heart rate.

## 2017-05-21 NOTE — Addendum Note (Signed)
Addended by: Evelina Dun A on: 05/21/2017 04:21 PM   Modules accepted: Orders

## 2017-05-21 NOTE — Telephone Encounter (Signed)
FYI for provider  She went to hospital due to worrying over heart rate.  EkG was normal.  She is having sinus surgery very soon so she is not taking the lexapro for now.   Advised her to let us know later if she wants to start another depression/anxiety medication after her recuperation.

## 2017-05-21 NOTE — Patient Instructions (Addendum)
I have reviewed your recent chest x-ray which shows stable scarring in the right lung We will not need any more steroids for the lung We can stop the [facility] for now and see how you do.  Continue with albuterol as needed. We will keep a close watch to make sure that the sarcoid does not become active again From my perspective it is okay to undergo sinus surgery by Dr. Benjamine Mola We will get records from his office for our review Follow-up in 6 months.
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
## 2017-05-25 ENCOUNTER — Other Ambulatory Visit (INDEPENDENT_AMBULATORY_CARE_PROVIDER_SITE_OTHER): Payer: Self-pay | Admitting: Otolaryngology

## 2017-06-04 ENCOUNTER — Other Ambulatory Visit: Payer: Self-pay | Admitting: *Deleted

## 2017-06-04 MED ORDER — HYDROXYZINE PAMOATE 25 MG PO CAPS: 25.0000 mg | ORAL_CAPSULE | Freq: Three times a day (TID) | ORAL | 0 refills | Status: DC | PRN

## 2017-06-12 ENCOUNTER — Other Ambulatory Visit: Payer: Self-pay | Admitting: Pulmonary Disease

## 2017-06-22 ENCOUNTER — Encounter: Payer: Self-pay | Admitting: Family

## 2017-06-28 ENCOUNTER — Encounter: Payer: Self-pay | Admitting: Family

## 2017-06-28 ENCOUNTER — Ambulatory Visit: Payer: Commercial Managed Care - PPO | Admitting: Family

## 2017-06-28 VITALS — BP 120/86 | HR 91 | Temp 97.9°F | Ht 63.0 in | Wt 135.4 lb

## 2017-06-28 DIAGNOSIS — E871 Hypo-osmolality and hyponatremia: Secondary | ICD-10-CM | POA: Diagnosis not present

## 2017-06-28 DIAGNOSIS — R42 Dizziness and giddiness: Secondary | ICD-10-CM

## 2017-06-28 MED ORDER — MECLIZINE HCL 25 MG PO TABS: 25.0000 mg | ORAL_TABLET | Freq: Three times a day (TID) | ORAL | 0 refills | Status: DC | PRN

## 2017-06-28 NOTE — Patient Instructions (Addendum)
In a few days you may receive a survey in the mail or online from [employer] regarding your visit with [place] today. Please take a moment to fill this out. Your feedback is very important to our whole office. It can help [place] better understand your needs as well as improve your experience and satisfaction. Thank you for taking your time to complete it. We care about you.  Evelina Dun, FNP  Vertigo Vertigo is the feeling that you or your surroundings are moving when they are not. Vertigo can be dangerous if it occurs while you are doing something that could endanger you or others, such as driving. What are the causes? This condition is caused by a disturbance in the signals that are sent by your body's sensory systems to your brain. Different causes of a disturbance can lead to vertigo, including:  Infections, especially in the inner ear.  A bad reaction to a drug, or misuse of alcohol and medicines.  Withdrawal from drugs or alcohol.  Quickly changing positions, as when lying down or rolling over in bed.  Migraine headaches.  Decreased blood flow to the brain.  Decreased blood pressure.  Increased pressure in the brain from a head or neck injury, stroke, infection, tumor, or bleeding.  Central nervous system disorders.  What are the signs or symptoms? Symptoms of this condition usually occur when you move your head or your eyes in different directions. Symptoms may start suddenly, and they usually last for less than a minute. Symptoms may include:  Loss of balance and falling.  Feeling like you are spinning or moving.  Feeling like your surroundings are spinning or moving.  Nausea and vomiting.  Blurred vision or double vision.  Difficulty hearing.  Slurred speech.  Dizziness.  Involuntary eye movement (nystagmus).  Symptoms can be mild and cause only slight annoyance, or they can be severe and interfere with daily life. Episodes of vertigo may return (recur) over time,  and they are often triggered by certain movements. Symptoms may improve over time. How is this diagnosed? This condition may be diagnosed based on medical history and the quality of your nystagmus. Your health care provider may test your eye movements by asking you to quickly change positions to trigger the nystagmus. This may be called the Dix-Hallpike test, head thrust test, or roll test. You may be referred to a health care provider who specializes in ear, nose, and throat (ENT) problems (otolaryngologist) or a provider who specializes in disorders of the central nervous system (neurologist). You may have additional testing, including:  A physical exam.  Blood tests.  MRI.  A CT scan.  An electrocardiogram (ECG). This records electrical activity in your heart.  An electroencephalogram (EEG). This records electrical activity in your brain.  Hearing tests.  How is this treated? Treatment for this condition depends on the cause and the severity of the symptoms. Treatment options include:  Medicines to treat nausea or vertigo. These are usually used for severe cases. Some medicines that are used to treat other conditions may also reduce or eliminate vertigo symptoms. These include: ? Medicines that control allergies (antihistamines). ? Medicines that control seizures (anticonvulsants). ? Medicines that relieve depression (antidepressants). ? Medicines that relieve anxiety (sedatives).  Head movements to adjust your inner ear back to normal. If your vertigo is caused by an ear problem, your health care provider may recommend certain movements to correct the problem.  Surgery. This is rare.  Follow these instructions at home: Safety  Move slowly.Avoid sudden body or head movements.  Avoid driving.  Avoid operating heavy machinery.  Avoid doing any tasks that would cause danger to you or others if you would have a vertigo episode during the task.  If you have trouble walking or  keeping your balance, try using a cane for stability. If you feel dizzy or unstable, sit down right away.  Return to your normal activities as told by your health care provider. Ask your health care provider what activities are safe for you. General instructions  Take over-the-counter and prescription medicines only as told by your health care provider.  Avoid certain positions or movements as told by your health care provider.  Drink enough fluid to keep your urine clear or pale yellow.  Keep all follow-up visits as told by your health care provider. This is important. Contact a health care provider if:  Your medicines do not relieve your vertigo or they make it worse.  You have a fever.  Your condition gets worse or you develop new symptoms.  Your family or friends notice any behavioral changes.  Your nausea or vomiting gets worse.  You have numbness or a "pins and needles" sensation in part of your body. Get help right away if:  You have difficulty moving or speaking.  You are always dizzy.  You faint.  You develop severe headaches.  You have weakness in your hands, arms, or legs.  You have changes in your hearing or vision.  You develop a stiff neck.  You develop sensitivity to light. This information is not intended to replace advice given to you by your health care provider. Make sure you discuss any questions you have with your health care provider. Document Released: 12/21/2004 Document Revised: 08/25/2015 Document Reviewed: 07/06/2014 Elsevier Interactive Patient Education  [employer].

## 2017-06-28 NOTE — Progress Notes (Signed)
   Subjective:    Patient ID: Brenda Walker, female    DOB: 03/08/1971, 47 y.o.   MRN: [id number]  PT presents to the office today for complaints of Vertigo and to have sodium rechecked. Pt states she went to her ENT doctor yesterday and diagnosed with Vertigo.  Dizziness  This is a new problem. The current episode started in the past 7 days. The problem occurs intermittently. The problem has been waxing and waning. Associated symptoms include congestion and vertigo. Pertinent negatives include no coughing. The symptoms are aggravated by bending. She has tried nothing for the symptoms. The treatment provided no relief.      Review of Systems  HENT: Positive for congestion.   Respiratory: Negative for cough.   Neurological: Positive for dizziness and vertigo.  All other systems reviewed and are negative.      Objective:   Physical Exam  Constitutional: She is oriented to person, place, and time. She appears well-developed and well-nourished. No distress.  HENT:  Head: Normocephalic and atraumatic.  Right Ear: External ear normal.  Left Ear: External ear normal.  Nose: Mucosal edema and rhinorrhea present.  Mouth/Throat: Oropharynx is clear and moist.  Eyes: Pupils are equal, round, and reactive to light.  Neck: Normal range of motion. Neck supple. No thyromegaly present.  Cardiovascular: Normal rate, regular rhythm, normal heart sounds and intact distal pulses.  No murmur heard. Pulmonary/Chest: Effort normal and breath sounds normal. No respiratory distress. She has no wheezes.  Abdominal: Soft. Bowel sounds are normal. She exhibits no distension. There is no tenderness.  Musculoskeletal: Normal range of motion. She exhibits no edema or tenderness.  Neurological: She is alert and oriented to person, place, and time.  Skin: Skin is warm and dry.  Psychiatric: She has a normal mood and affect. Her behavior is normal. Judgment and thought content normal.  Vitals  reviewed.     BP 120/86   Pulse 91   Temp 97.9 F (36.6 C) (Oral)   Ht '5\' 3"'$  (1.6 m)   Wt 135 lb 6.4 oz (61.4 kg)   BMI 23.99 kg/m      Assessment & Plan:  1. Vertigo Avoid fast position changes  Falls preventions discussed RTO prn  - BMP8+EGFR - meclizine (ANTIVERT) 25 MG tablet; Take 1 tablet (25 mg total) by mouth 3 (three) times daily as needed for dizziness.  Dispense: 30 tablet; Refill: 0  2. Hyponatremia Limit fluids Increase salt intake  - BMP8+EGFR    Evelina Dun, FNP

## 2017-06-29 LAB — BMP8+EGFR
BUN/Creatinine Ratio: 11 (ref 9–23)
BUN: 10 mg/dL (ref 6–24)
CALCIUM: 9.6 mg/dL (ref 8.7–10.2)
CHLORIDE: 102 mmol/L (ref 96–106)
CO2: 21 mmol/L (ref 20–29)
Creatinine, Ser: 0.9 mg/dL (ref 0.57–1.00)
GFR calc Af Amer: 87 mL/min/{1.73_m2} (ref >59)
GFR, EST NON AFRICAN AMERICAN: 75 mL/min/{1.73_m2} (ref >59)
Glucose: 86 mg/dL (ref 65–99)
POTASSIUM: 4.1 mmol/L (ref 3.5–5.2)
SODIUM: 139 mmol/L (ref 134–144)

## 2017-07-06 ENCOUNTER — Telehealth: Payer: Self-pay | Admitting: Family

## 2017-07-06 DIAGNOSIS — R42 Dizziness and giddiness: Secondary | ICD-10-CM

## 2017-07-06 MED ORDER — MECLIZINE HCL 25 MG PO TABS: 25.0000 mg | ORAL_TABLET | Freq: Three times a day (TID) | ORAL | 1 refills | Status: DC | PRN

## 2017-07-06 NOTE — Telephone Encounter (Signed)
Prescription sent to pharmacy.

## 2017-08-04 ENCOUNTER — Other Ambulatory Visit: Payer: Self-pay | Admitting: Family

## 2017-08-07 ENCOUNTER — Other Ambulatory Visit: Payer: Self-pay | Admitting: Family

## 2017-08-07 DIAGNOSIS — F411 Generalized anxiety disorder: Secondary | ICD-10-CM

## 2017-11-09 ENCOUNTER — Encounter: Payer: Self-pay | Admitting: Pulmonary Disease

## 2017-11-09 ENCOUNTER — Ambulatory Visit: Payer: Commercial Managed Care - PPO | Admitting: Pulmonary Disease

## 2017-11-09 VITALS — BP 128/80 | HR 75 | Ht 63.0 in | Wt 147.6 lb

## 2017-11-09 DIAGNOSIS — R911 Solitary pulmonary nodule: Secondary | ICD-10-CM

## 2017-11-09 DIAGNOSIS — D869 Sarcoidosis, unspecified: Secondary | ICD-10-CM

## 2017-11-09 NOTE — Patient Instructions (Signed)
I am glad you are doing well with the breathing  I will see you back in 1 years with a chest x-ray before clinic visit Make sure that you follow-up with the ophthalmologist every year
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
## 2017-11-13 ENCOUNTER — Other Ambulatory Visit: Payer: Self-pay | Admitting: Family

## 2017-11-13 DIAGNOSIS — F411 Generalized anxiety disorder: Secondary | ICD-10-CM

## 2017-11-30 IMAGING — CT CT CHEST W/O CM
2 of 4 series · 15 of 36 positions shown, 18 images · non-contrast
Comparison: Chest CT 06/16/2016

CLINICAL DATA: Followup right-sided pneumothorax.

EXAM:
CT CHEST WITHOUT CONTRAST
TECHNIQUE: Multidetector CT imaging of the chest was performed following the
standard protocol without IV contrast.

[Series 3: chest w/o 2mm st · axial · non-contrast · 0.62mm/px · z∈[+1030,+1264]mm · 12 of 135 slices shown, 15 images]
[im 9/135  mediastinal]
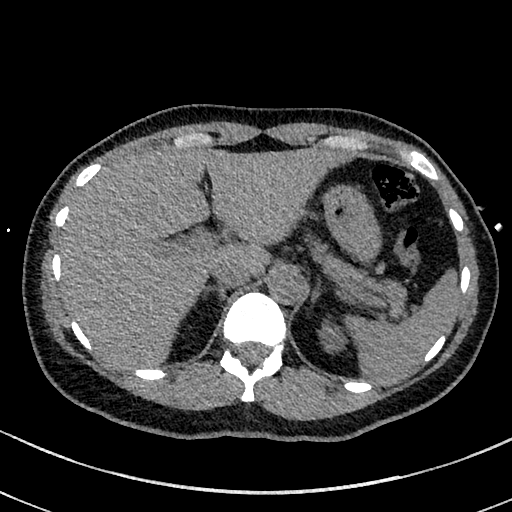
[im 9/135  lung]
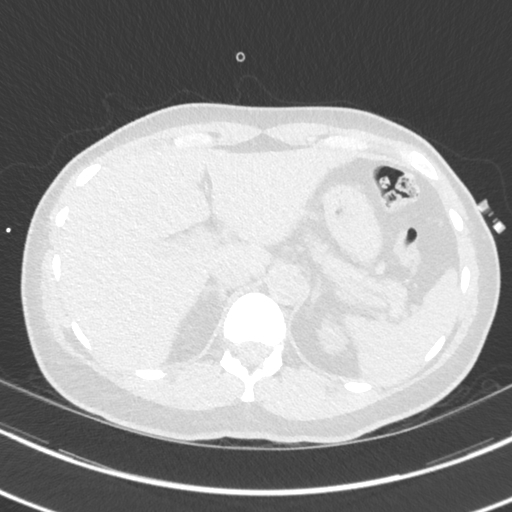
[im 18/135  lung]
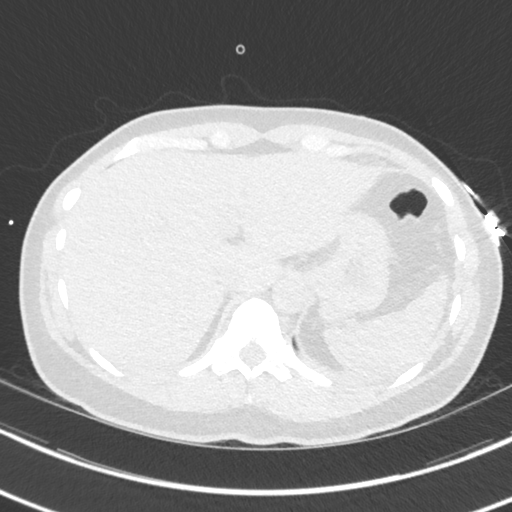
[im 27/135  lung]
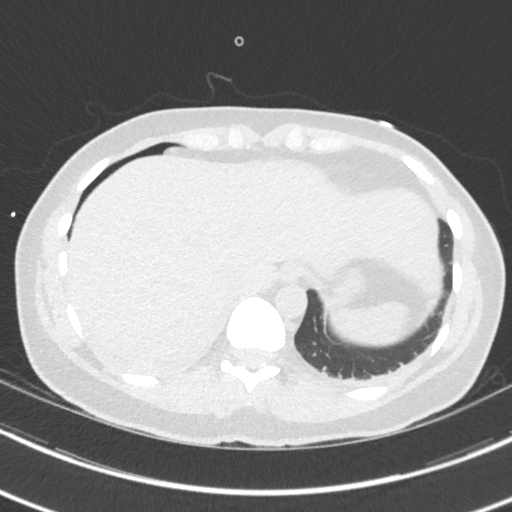
[im 45/135  lung]
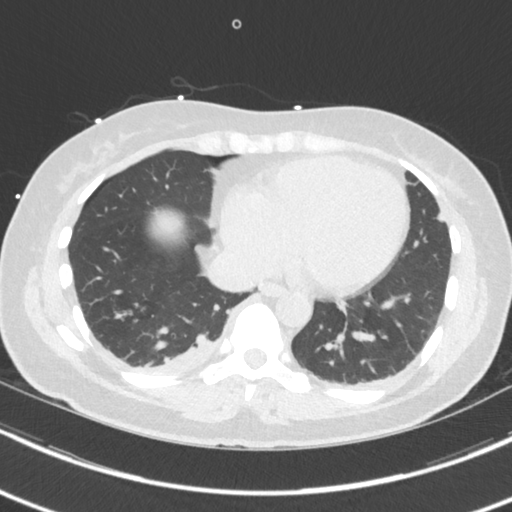
[im 54/135  mediastinal]
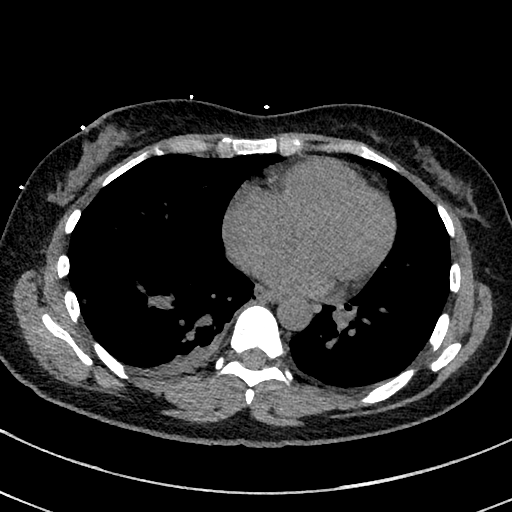
[im 54/135  lung]
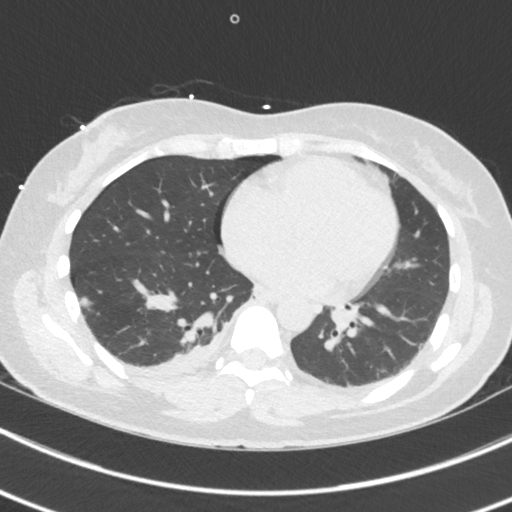
[im 63/135  lung]
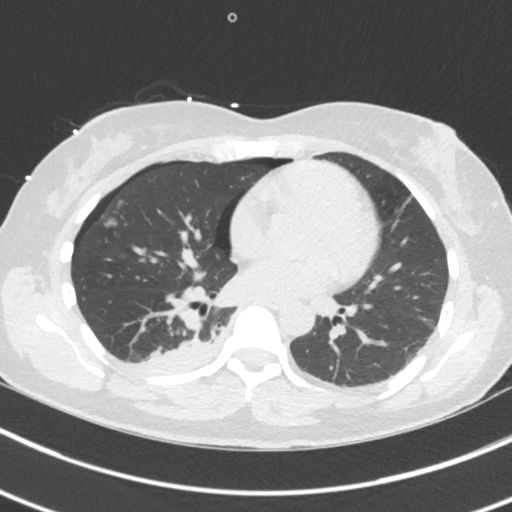
[im 72/135  lung]
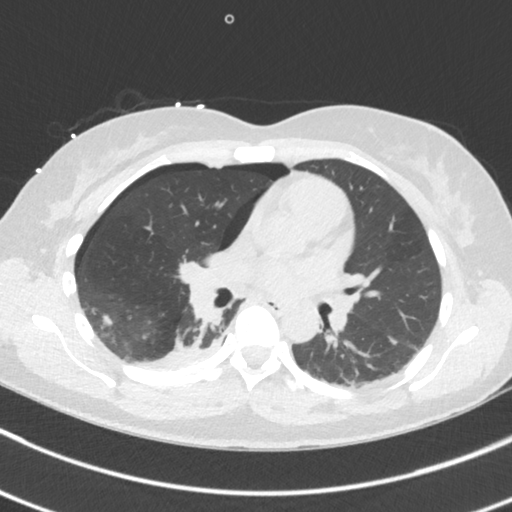
[im 81/135  lung]
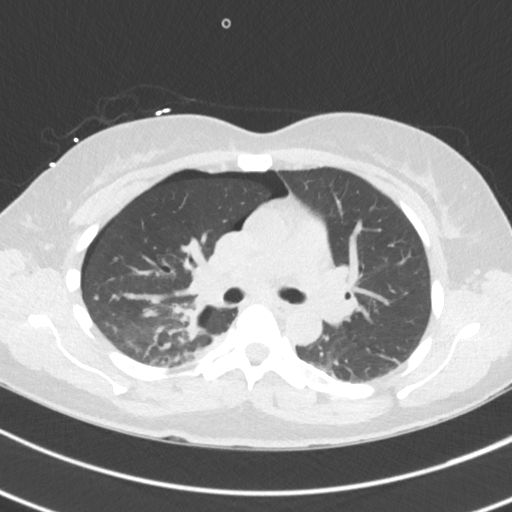
[im 90/135  mediastinal]
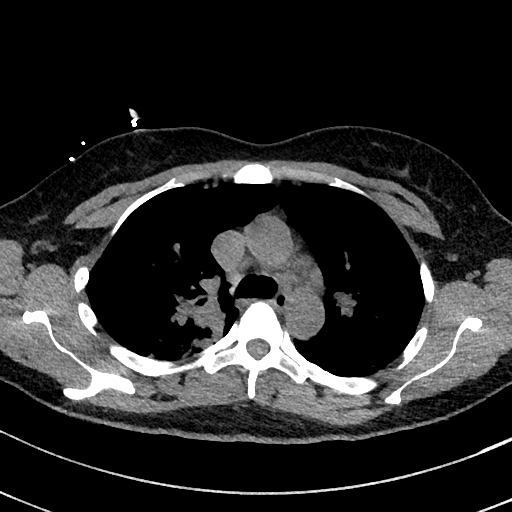
[im 90/135  lung]
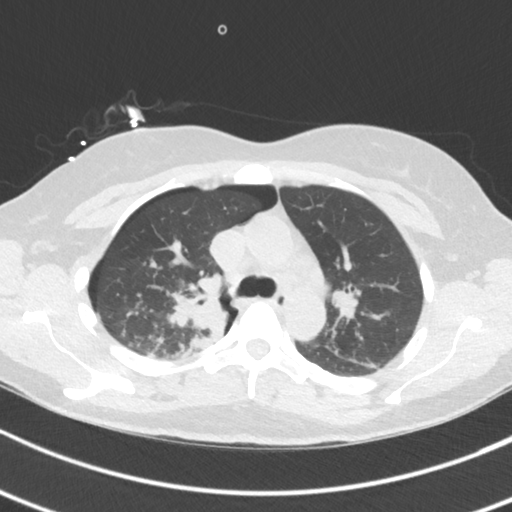
[im 108/135  lung]
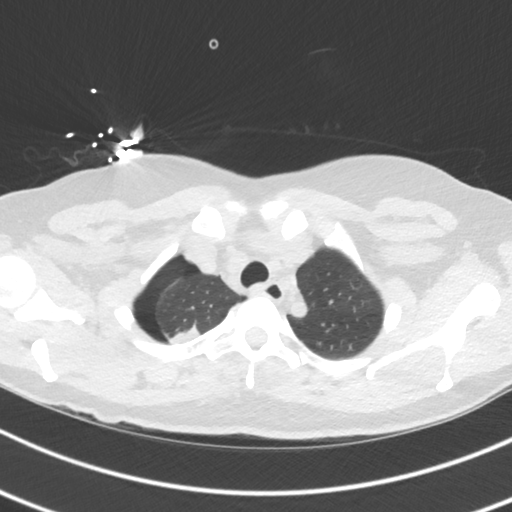
[im 117/135  lung]
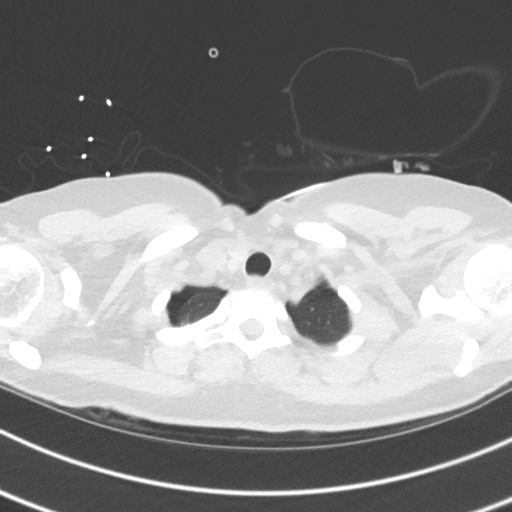
[im 126/135  lung]
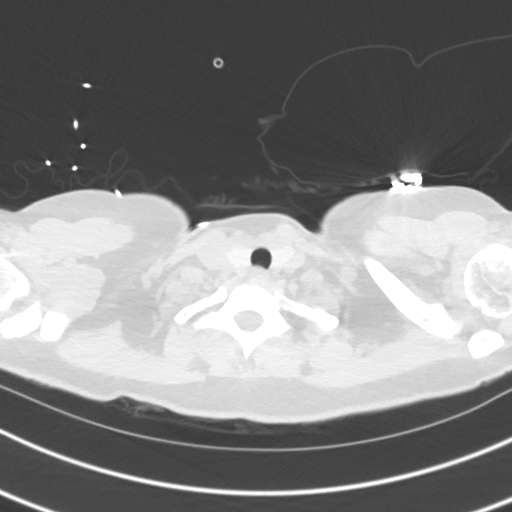

[Series 8: chest w/o 3mm st cor · coronal · non-contrast · 0.53mm/px · 3 of 75 slices shown]
[im 15/75  lung]
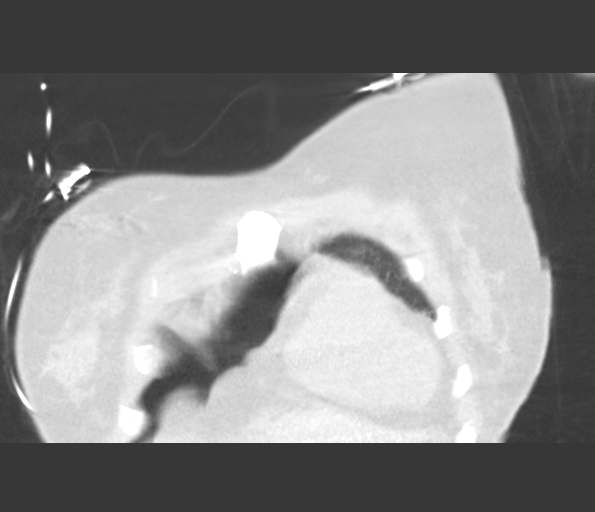
[im 30/75  lung]
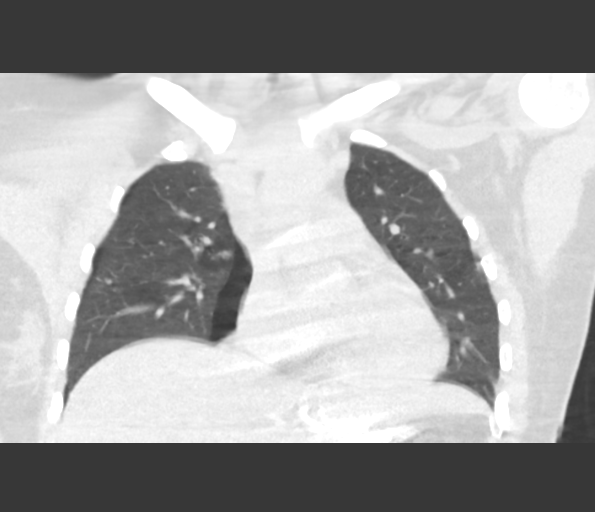
[im 45/75  lung]
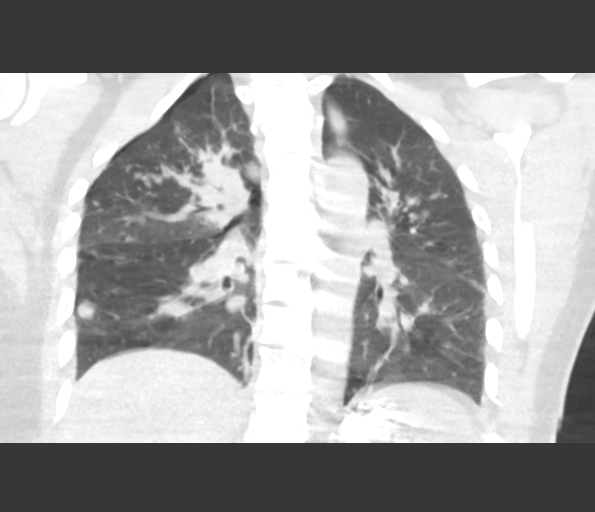

[15 of 36 positions shown; findings below may reference images not displayed]

FINDINGS: Cardiovascular: The heart is normal in size. No pericardial
effusion. Stable mild tortuosity of the thoracic aorta. No
atherosclerotic calcifications. No definite coronary artery
calcifications.

Mediastinum/Nodes: Enlarged mediastinal and hilar lymph nodes
persists. The esophagus is grossly normal.

Lungs/Pleura: Persistent marked nodularity along the bronchovascular
bundles and patchy airspace nodules bilaterally. Is also right-sided
pleural thickening and a small pleural effusion.

There is a small persistent right-sided pneumothorax estimated at
10-15%. No left-sided pneumothorax.

Upper Abdomen: No significant upper abdominal findings.

Musculoskeletal: No significant bony findings.
IMPRESSION: 1. Persistent mediastinal/hilar lymphadenopathy and marked
nodularity along the bronchovascular bundles and pleura. Findings
are suspicious for sarcoidosis. Lymphoma and other atypical
infections are possible.
2. Stable 10- 15% right-sided pneumothorax.

## 2018-03-04 ENCOUNTER — Other Ambulatory Visit: Payer: Self-pay | Admitting: Family Medicine

## 2018-03-04 DIAGNOSIS — Z1231 Encounter for screening mammogram for malignant neoplasm of breast: Secondary | ICD-10-CM

## 2018-03-05 ENCOUNTER — Other Ambulatory Visit: Payer: Self-pay | Admitting: Family

## 2018-03-05 DIAGNOSIS — G44009 Cluster headache syndrome, unspecified, not intractable: Secondary | ICD-10-CM

## 2018-03-05 DIAGNOSIS — R519 Headache, unspecified: Secondary | ICD-10-CM

## 2018-03-05 DIAGNOSIS — R51 Headache: Principal | ICD-10-CM

## 2018-04-18 ENCOUNTER — Ambulatory Visit
Admission: RE | Admit: 2018-04-18 | Discharge: 2018-04-18 | Disposition: A | Discharge status: Home / Self Care | Payer: Commercial Managed Care - PPO | Source: Ambulatory Visit | Attending: Family Medicine | Admitting: Family Medicine

## 2018-04-18 DIAGNOSIS — Z1231 Encounter for screening mammogram for malignant neoplasm of breast: Secondary | ICD-10-CM

## 2018-06-01 ENCOUNTER — Other Ambulatory Visit: Payer: Self-pay | Admitting: Family

## 2018-06-01 DIAGNOSIS — R51 Headache: Principal | ICD-10-CM

## 2018-06-01 DIAGNOSIS — R519 Headache, unspecified: Secondary | ICD-10-CM

## 2018-06-01 DIAGNOSIS — G44009 Cluster headache syndrome, unspecified, not intractable: Secondary | ICD-10-CM

## 2018-06-06 ENCOUNTER — Encounter: Payer: Commercial Managed Care - PPO | Admitting: Family

## 2018-06-11 ENCOUNTER — Telehealth: Payer: Self-pay | Admitting: Pulmonary Disease

## 2018-06-11 NOTE — Telephone Encounter (Signed)
Returned phone call to patient, requesting recommendations regarding protecting herself against the virus. Informed patients if she is able to work from home to do so, avoid crowds, frequent hand washing, avoid sick people. Voiced understanding. Informed that if she does feel like she is getting sick to call immediatly and not wait. Nothing further is needed at this time.

## 2018-06-17 ENCOUNTER — Encounter: Payer: Commercial Managed Care - PPO | Admitting: Family

## 2018-06-26 ENCOUNTER — Other Ambulatory Visit: Payer: Self-pay | Admitting: Family

## 2018-06-26 DIAGNOSIS — R51 Headache: Principal | ICD-10-CM

## 2018-06-26 DIAGNOSIS — R519 Headache, unspecified: Secondary | ICD-10-CM

## 2018-06-26 DIAGNOSIS — G44009 Cluster headache syndrome, unspecified, not intractable: Secondary | ICD-10-CM

## 2018-06-26 NOTE — Telephone Encounter (Signed)
Last seen 06/28/17

## 2018-06-27 ENCOUNTER — Telehealth: Payer: Self-pay | Admitting: *Deleted

## 2018-06-27 DIAGNOSIS — F411 Generalized anxiety disorder: Secondary | ICD-10-CM

## 2018-06-27 MED ORDER — HYDROXYZINE PAMOATE 25 MG PO CAPS: 25.0000 mg | ORAL_CAPSULE | Freq: Three times a day (TID) | ORAL | 1 refills | Status: DC | PRN

## 2018-06-27 MED ORDER — ESCITALOPRAM OXALATE 10 MG PO TABS: 10.0000 mg | ORAL_TABLET | Freq: Every day | ORAL | 1 refills | Status: DC

## 2018-06-27 NOTE — Telephone Encounter (Signed)
Rescheduled CPE for May.  Patient wants a refill on anxiety medication.

## 2018-06-27 NOTE — Telephone Encounter (Signed)
Prescription sent to pharmacy.

## 2018-07-03 ENCOUNTER — Encounter: Payer: Commercial Managed Care - PPO | Admitting: Family

## 2018-07-22 ENCOUNTER — Other Ambulatory Visit: Payer: Self-pay | Admitting: Family

## 2018-07-22 DIAGNOSIS — G44009 Cluster headache syndrome, unspecified, not intractable: Secondary | ICD-10-CM

## 2018-07-22 DIAGNOSIS — R51 Headache: Principal | ICD-10-CM

## 2018-07-22 DIAGNOSIS — R519 Headache, unspecified: Secondary | ICD-10-CM

## 2018-08-12 ENCOUNTER — Encounter: Payer: Commercial Managed Care - PPO | Admitting: Family

## 2018-08-22 ENCOUNTER — Other Ambulatory Visit: Payer: Self-pay | Admitting: Family

## 2018-08-22 DIAGNOSIS — G44009 Cluster headache syndrome, unspecified, not intractable: Secondary | ICD-10-CM

## 2018-08-22 DIAGNOSIS — R519 Headache, unspecified: Secondary | ICD-10-CM

## 2018-08-22 NOTE — Telephone Encounter (Signed)
Must be seen last appointment 06/2017

## 2018-09-16 ENCOUNTER — Other Ambulatory Visit: Payer: Self-pay | Admitting: Family

## 2018-09-16 DIAGNOSIS — R519 Headache, unspecified: Secondary | ICD-10-CM

## 2018-09-16 DIAGNOSIS — G44009 Cluster headache syndrome, unspecified, not intractable: Secondary | ICD-10-CM

## 2018-09-17 NOTE — Telephone Encounter (Signed)
Hawks. NTBS 30 days given 08/22/18

## 2018-10-07 ENCOUNTER — Other Ambulatory Visit: Payer: Self-pay | Admitting: Family

## 2018-10-07 DIAGNOSIS — G44009 Cluster headache syndrome, unspecified, not intractable: Secondary | ICD-10-CM

## 2018-10-07 DIAGNOSIS — R519 Headache, unspecified: Secondary | ICD-10-CM

## 2018-10-07 MED ORDER — NORGESTIM-ETH ESTRAD TRIPHASIC 0.18/0.215/0.25 MG-35 MCG PO TABS: 1.0000 | ORAL_TABLET | Freq: Every day | ORAL | 0 refills | Status: DC

## 2018-10-07 NOTE — Telephone Encounter (Signed)
Patient aware refill has been sent to pharmacy and to keep appointment in august

## 2018-10-27 ENCOUNTER — Other Ambulatory Visit: Payer: Self-pay | Admitting: Family

## 2018-10-27 DIAGNOSIS — R519 Headache, unspecified: Secondary | ICD-10-CM

## 2018-10-27 DIAGNOSIS — G44009 Cluster headache syndrome, unspecified, not intractable: Secondary | ICD-10-CM

## 2018-10-30 ENCOUNTER — Other Ambulatory Visit: Payer: Self-pay

## 2018-10-31 ENCOUNTER — Encounter: Payer: Self-pay | Admitting: Family

## 2018-10-31 ENCOUNTER — Ambulatory Visit (INDEPENDENT_AMBULATORY_CARE_PROVIDER_SITE_OTHER): Payer: Commercial Managed Care - PPO | Admitting: Family

## 2018-10-31 VITALS — BP 119/81 | HR 90 | Temp 96.0°F | Ht 63.0 in | Wt 157.0 lb

## 2018-10-31 DIAGNOSIS — Z01411 Encounter for gynecological examination (general) (routine) with abnormal findings: Secondary | ICD-10-CM

## 2018-10-31 DIAGNOSIS — E663 Overweight: Secondary | ICD-10-CM | POA: Diagnosis not present

## 2018-10-31 DIAGNOSIS — F411 Generalized anxiety disorder: Secondary | ICD-10-CM

## 2018-10-31 DIAGNOSIS — Z0001 Encounter for general adult medical examination with abnormal findings: Secondary | ICD-10-CM | POA: Diagnosis not present

## 2018-10-31 DIAGNOSIS — Z01419 Encounter for gynecological examination (general) (routine) without abnormal findings: Secondary | ICD-10-CM

## 2018-10-31 DIAGNOSIS — Z0000 Encounter for general adult medical examination without abnormal findings: Secondary | ICD-10-CM

## 2018-10-31 DIAGNOSIS — D869 Sarcoidosis, unspecified: Secondary | ICD-10-CM

## 2018-10-31 DIAGNOSIS — Z1211 Encounter for screening for malignant neoplasm of colon: Secondary | ICD-10-CM

## 2018-10-31 MED ORDER — ESCITALOPRAM OXALATE 10 MG PO TABS: 10.0000 mg | ORAL_TABLET | Freq: Every day | ORAL | 4 refills | Status: DC

## 2018-10-31 MED ORDER — HYDROXYZINE PAMOATE 25 MG PO CAPS: 25.0000 mg | ORAL_CAPSULE | Freq: Three times a day (TID) | ORAL | 2 refills | Status: DC | PRN

## 2018-10-31 NOTE — Patient Instructions (Signed)
Health Maintenance, Female Adopting a healthy lifestyle and getting preventive care are important in promoting health and wellness. Ask your health care provider about:  The right schedule for you to have regular tests and exams.  Things you can do on your own to prevent diseases and keep yourself healthy. What should I know about diet, weight, and exercise? Eat a healthy diet   Eat a diet that includes plenty of vegetables, fruits, low-fat dairy products, and lean protein.  Do not eat a lot of foods that are high in solid fats, added sugars, or sodium. Maintain a healthy weight Body mass index (BMI) is used to identify weight problems. It estimates body fat based on height and weight. Your health care provider can help determine your BMI and help you achieve or maintain a healthy weight. Get regular exercise Get regular exercise. This is one of the most important things you can do for your health. Most adults should:  Exercise for at least 150 minutes each week. The exercise should increase your heart rate and make you sweat (moderate-intensity exercise).  Do strengthening exercises at least twice a week. This is in addition to the moderate-intensity exercise.  Spend less time sitting. Even light physical activity can be beneficial. Watch cholesterol and blood lipids Have your blood tested for lipids and cholesterol at 50 years of age, then have this test every 5 years. Have your cholesterol levels checked more often if:  Your lipid or cholesterol levels are high.  You are older than 50 years of age.  You are at high risk for heart disease. What should I know about cancer screening? Depending on your health history and family history, you may need to have cancer screening at various ages. This may include screening for:  Breast cancer.  Cervical cancer.  Colorectal cancer.  Skin cancer.  Lung cancer. What should I know about heart disease, diabetes, and high blood  pressure? Blood pressure and heart disease  High blood pressure causes heart disease and increases the risk of stroke. This is more likely to develop in people who have high blood pressure readings, are of African descent, or are overweight.  Have your blood pressure checked: ? Every 3-5 years if you are 18-39 years of age. ? Every year if you are 40 years old or older. Diabetes Have regular diabetes screenings. This checks your fasting blood sugar level. Have the screening done:  Once every three years after age 40 if you are at a normal weight and have a low risk for diabetes.  More often and at a younger age if you are overweight or have a high risk for diabetes. What should I know about preventing infection? Hepatitis B If you have a higher risk for hepatitis B, you should be screened for this virus. Talk with your health care provider to find out if you are at risk for hepatitis B infection. Hepatitis C Testing is recommended for:  Everyone born from 1945 through 1965.  Anyone with known risk factors for hepatitis C. Sexually transmitted infections (STIs)  Get screened for STIs, including gonorrhea and chlamydia, if: ? You are sexually active and are younger than 50 years of age. ? You are older than 50 years of age and your health care provider tells you that you are at risk for this type of infection. ? Your sexual activity has changed since you were last screened, and you are at increased risk for chlamydia or gonorrhea. Ask your health care provider if   you are at risk.  Ask your health care provider about whether you are at high risk for HIV. Your health care provider may recommend a prescription medicine to help prevent HIV infection. If you choose to take medicine to prevent HIV, you should first get tested for HIV. You should then be tested every 3 months for as long as you are taking the medicine. Pregnancy  If you are about to stop having your period (premenopausal) and  you may become pregnant, seek counseling before you get pregnant.  Take 400 to 800 micrograms (mcg) of folic acid every day if you become pregnant.  Ask for birth control (contraception) if you want to prevent pregnancy. Osteoporosis and menopause Osteoporosis is a disease in which the bones lose minerals and strength with aging. This can result in bone fractures. If you are 65 years old or older, or if you are at risk for osteoporosis and fractures, ask your health care provider if you should:  Be screened for bone loss.  Take a calcium or vitamin D supplement to lower your risk of fractures.  Be given hormone replacement therapy (HRT) to treat symptoms of menopause. Follow these instructions at home: Lifestyle  Do not use any products that contain nicotine or tobacco, such as cigarettes, e-cigarettes, and chewing tobacco. If you need help quitting, ask your health care provider.  Do not use street drugs.  Do not share needles.  Ask your health care provider for help if you need support or information about quitting drugs. Alcohol use  Do not drink alcohol if: ? Your health care provider tells you not to drink. ? You are pregnant, may be pregnant, or are planning to become pregnant.  If you drink alcohol: ? Limit how much you use to 0-1 drink a day. ? Limit intake if you are breastfeeding.  Be aware of how much alcohol is in your drink. In the [place] one drink equals one 12 oz bottle of beer (355 mL), one 5 oz glass of wine (148 mL), or one 1 oz glass of hard liquor (44 mL). General instructions  Schedule regular health, dental, and eye exams.  Stay current with your vaccines.  Tell your health care provider if: ? You often feel depressed. ? You have ever been abused or do not feel safe at home. Summary  Adopting a healthy lifestyle and getting preventive care are important in promoting health and wellness.  Follow your health care provider's instructions about healthy  diet, exercising, and getting tested or screened for diseases.  Follow your health care provider's instructions on monitoring your cholesterol and blood pressure. This information is not intended to replace advice given to you by your health care provider. Make sure you discuss any questions you have with your health care provider. Document Released: 09/26/2010 Document Revised: 03/06/2018 Document Reviewed: 03/06/2018 Elsevier Patient Education  2020 Else[employer] Inc[employer]  

## 2018-10-31 NOTE — Progress Notes (Signed)
Subjective:    Patient ID: Brenda Walker, female    DOB: [birth year], 48 y.o.   MRN: [id number]  Chief Complaint  Patient presents with  . Gynecologic Exam    pap   Pt presents to the office today for CPE with pap. Pt is followed by Pulmonologist for sarcoidosis and get CT chest annually.  Gynecologic Exam The patient's pertinent negatives include no vaginal bleeding or vaginal discharge.  Anxiety Presents for follow-up visit. Symptoms include excessive worry, irritability, nervous/anxious behavior and restlessness. Patient reports no decreased concentration, depressed mood or insomnia. Symptoms occur occasionally. The severity of symptoms is moderate. The quality of sleep is good.        Review of Systems  Constitutional: Positive for irritability.  Genitourinary: Negative for vaginal discharge.  Psychiatric/Behavioral: Negative for decreased concentration. The patient is nervous/anxious. The patient does not have insomnia.   All other systems reviewed and are negative.      Objective:   Physical Exam Vitals signs reviewed.  Constitutional:      General: She is not in acute distress.    Appearance: She is well-developed.  HENT:     Head: Normocephalic and atraumatic.     Right Ear: Tympanic membrane normal.     Left Ear: Tympanic membrane normal.  Eyes:     Pupils: Pupils are equal, round, and reactive to light.  Neck:     Musculoskeletal: Normal range of motion and neck supple.     Thyroid: No thyromegaly.  Cardiovascular:     Rate and Rhythm: Normal rate and regular rhythm.     Heart sounds: Normal heart sounds. No murmur.  Pulmonary:     Effort: Pulmonary effort is normal. No respiratory distress.     Breath sounds: Normal breath sounds. No wheezing.  Abdominal:     General: Bowel sounds are normal. There is no distension.     Palpations: Abdomen is soft.     Tenderness: There is no abdominal tenderness.  Genitourinary:    General: Normal vulva.   Comments: Bimanual exam- no adnexal masses or tenderness, ovaries nonpalpable   Cervix parous and pink- No discharge  Musculoskeletal: Normal range of motion.        General: No tenderness.  Skin:    General: Skin is warm and dry.  Neurological:     Mental Status: She is alert and oriented to person, place, and time.     Cranial Nerves: No cranial nerve deficit.     Deep Tendon Reflexes: Reflexes are normal and symmetric.  Psychiatric:        Behavior: Behavior normal.        Thought Content: Thought content normal.        Judgment: Judgment normal.       BP 119/81   Pulse 90   Temp (!) 96 F (35.6 C) (Temporal)   Ht _0  (1.6 m)   Wt 157 lb (71.2 kg)   LMP 09/29/2018   BMI 27.81 kg/m      Assessment & Plan:  Brenda Walker comes in today with chief complaint of Gynecologic Exam (pap)   Diagnosis and orders addressed:  1. Annual physical exam - CMP14+EGFR - CBC with Differential/Platelet - Lipid panel - TSH - IGP, Aptima HPV, rfx 16/18,45 - Ambulatory referral to Gastroenterology  2. Overweight (BMI 25.0-29.9) - CMP14+EGFR - CBC with Differential/Platelet  3. Generalized anxiety disorder - CMP14+EGFR - CBC with Differential/Platelet - hydrOXYzine (VISTARIL) 25 MG capsule; Take 1 capsule (  25 mg total) by mouth every 8 (eight) hours as needed.  Dispense: 270 capsule; Refill: 2 - escitalopram (LEXAPRO) 10 MG tablet; Take 1 tablet (10 mg total) by mouth daily.  Dispense: 90 tablet; Refill: 4  4. Sarcoidosis - CMP14+EGFR - CBC with Differential/Platelet  5. Colon cancer screening - CMP14+EGFR - CBC with Differential/Platelet - Ambulatory referral to Gastroenterology  6. Gynecologic exam normal - CMP14+EGFR - CBC with Differential/Platelet - IGP, Aptima HPV, rfx 16/18,45   Labs pending Health Maintenance reviewed Diet and exercise encouraged  Follow up plan: 1 year    Evelina Dun, FNP

## 2018-11-01 LAB — CBC WITH DIFFERENTIAL/PLATELET
Basophils Absolute: 0.1 10*3/uL (ref 0.0–0.2)
Basos: 1 %
EOS (ABSOLUTE): 0.1 10*3/uL (ref 0.0–0.4)
Eos: 1 %
Hematocrit: 36.9 % (ref 34.0–46.6)
Hemoglobin: 12.3 g/dL (ref 11.1–15.9)
Immature Grans (Abs): 0 10*3/uL (ref 0.0–0.1)
Immature Granulocytes: 0 %
Lymphocytes Absolute: 1.1 10*3/uL (ref 0.7–3.1)
Lymphs: 21 %
MCH: 30.6 pg (ref 26.6–33.0)
MCHC: 33.3 g/dL (ref 31.5–35.7)
MCV: 92 fL (ref 79–97)
Monocytes Absolute: 0.4 10*3/uL (ref 0.1–0.9)
Monocytes: 9 %
Neutrophils Absolute: 3.5 10*3/uL (ref 1.4–7.0)
Neutrophils: 68 %
Platelets: 347 10*3/uL (ref 150–450)
RBC: 4.02 x10E6/uL (ref 3.77–5.28)
RDW: 12.9 % (ref 11.7–15.4)
WBC: 5.1 10*3/uL (ref 3.4–10.8)

## 2018-11-01 LAB — CMP14+EGFR
ALT: 18 IU/L (ref 0–32)
AST: 23 IU/L (ref 0–40)
Albumin/Globulin Ratio: 1.5 (ref 1.2–2.2)
Albumin: 4.4 g/dL (ref 3.8–4.8)
Alkaline Phosphatase: 78 IU/L (ref 39–117)
BUN/Creatinine Ratio: 13 (ref 9–23)
BUN: 12 mg/dL (ref 6–24)
Bilirubin Total: <0.2 mg/dL (ref 0.0–1.2)
CO2: 22 mmol/L (ref 20–29)
Calcium: 9.6 mg/dL (ref 8.7–10.2)
Chloride: 101 mmol/L (ref 96–106)
Creatinine, Ser: 0.91 mg/dL (ref 0.57–1.00)
GFR calc Af Amer: 85 mL/min/{1.73_m2} (ref >59)
GFR calc non Af Amer: 74 mL/min/{1.73_m2} (ref >59)
Globulin, Total: 3 g/dL (ref 1.5–4.5)
Glucose: 91 mg/dL (ref 65–99)
Potassium: 4 mmol/L (ref 3.5–5.2)
Sodium: 138 mmol/L (ref 134–144)
Total Protein: 7.4 g/dL (ref 6.0–8.5)

## 2018-11-01 LAB — TSH: TSH: 2.2 u[IU]/mL (ref 0.450–4.500)

## 2018-11-01 LAB — LIPID PANEL
Chol/HDL Ratio: 3.1 ratio (ref 0.0–4.4)
Cholesterol, Total: 210 mg/dL — ABNORMAL HIGH (ref 100–199)
HDL: 67 mg/dL (ref >39)
LDL Calculated: 121 mg/dL — ABNORMAL HIGH (ref 0–99)
Triglycerides: 111 mg/dL (ref 0–149)
VLDL Cholesterol Cal: 22 mg/dL (ref 5–40)

## 2018-11-05 LAB — IGP, APTIMA HPV, RFX 16/18,45: HPV Aptima: NEGATIVE

## 2018-11-11 ENCOUNTER — Encounter: Payer: Self-pay | Admitting: Internal Medicine

## 2018-11-20 ENCOUNTER — Other Ambulatory Visit: Payer: Self-pay | Admitting: Family

## 2018-11-20 DIAGNOSIS — R519 Headache, unspecified: Secondary | ICD-10-CM

## 2018-11-20 DIAGNOSIS — G44009 Cluster headache syndrome, unspecified, not intractable: Secondary | ICD-10-CM

## 2018-12-09 ENCOUNTER — Other Ambulatory Visit: Payer: Self-pay | Admitting: Family

## 2018-12-09 DIAGNOSIS — F411 Generalized anxiety disorder: Secondary | ICD-10-CM

## 2018-12-17 ENCOUNTER — Ambulatory Visit (INDEPENDENT_AMBULATORY_CARE_PROVIDER_SITE_OTHER): Payer: Self-pay | Admitting: *Deleted

## 2018-12-17 ENCOUNTER — Other Ambulatory Visit: Payer: Self-pay

## 2018-12-17 DIAGNOSIS — Z1211 Encounter for screening for malignant neoplasm of colon: Secondary | ICD-10-CM

## 2018-12-17 MED ORDER — PEG 3350-KCL-NA BICARB-NACL 420 G PO SOLR: 4000.0000 mL | Freq: Once | ORAL | 0 refills | Status: AC

## 2018-12-17 NOTE — Patient Instructions (Addendum)
Brenda Walker   07/05/1970 MRN: [id number]    Procedure Date: 07/03/2019 Time to register: 7:30 am Place to register: [facility] Procedure Time: 8:30 am Scheduled provider: Dr. Oneida Alar  PREPARATION FOR COLONOSCOPY WITH TRI-LYTE SPLIT PREP  Please notify us immediately if you are diabetic, take iron supplements, or if you are on Coumadin or any other blood thinners.    You will need to purchase 1 fleet enema and 1 box of Bisacodyl '5mg'$  tablets.   1 DAY BEFORE PROCEDURE:  DATE: 07/02/2019  DAY: Wednesday Continue clear liquids the entire day - NO SOLID FOOD.    At 2:00 pm:  Take 2 Bisacodyl tablets.   At 4:00pm:  Start drinking your solution. Make sure you mix well per instructions on the bottle. Try to drink 1 (one) 8 ounce glass every 10-15 minutes until you have consumed HALF the jug. You should complete by 6:00pm.You must keep the left over solution refrigerated until completed next day.  Continue clear liquids. You must drink plenty of clear liquids to prevent dehyration and kidney failure.     DAY OF PROCEDURE:   DATE: 07/03/2019   DAY: Thursday If you take medications for your heart, blood pressure or breathing, you may take these medications.   Five hours before your procedure time @ 3:30 am:  Finish remaining amout of bowel prep, drinking 1 (one) 8 ounce glass every 10-15 minutes until complete. You have two hours to consume remaining prep.   Three hours before your procedure time @ 5:30 am:  Nothing by mouth.   At least one hour before going to the hospital:  Give yourself one Fleet enema. You may take your morning medications with sip of water unless we have instructed otherwise.      Please see below for Dietary Information.  CLEAR LIQUIDS INCLUDE:  Water Jello (NOT red in color)   Ice Popsicles (NOT red in color)   Tea (sugar ok, no milk/cream) Powdered fruit flavored drinks  Coffee (sugar ok, no milk/cream) Gatorade/ Lemonade/ Kool-Aid  (NOT red in  color)   Juice: apple, white grape, white cranberry Soft drinks  Clear bullion, consomme, broth (fat free beef/chicken/vegetable)  Carbonated beverages (any kind)  Strained chicken noodle soup Hard Candy   Remember: Clear liquids are liquids that will allow you to see your fingers on the other side of a clear glass. Be sure liquids are NOT red in color, and not cloudy, but CLEAR.  DO NOT EAT OR DRINK ANY OF THE FOLLOWING:  Dairy products of any kind   Cranberry juice Tomato juice / V8 juice   Grapefruit juice Orange juice     Red grape juice  Do not eat any solid foods, including such foods as: cereal, oatmeal, yogurt, fruits, vegetables, creamed soups, eggs, bread, crackers, pureed foods in a blender, etc.   HELPFUL HINTS FOR DRINKING PREP SOLUTION:   Make sure prep is extremely cold. Mix and refrigerate the the morning of the prep. You may also put in the freezer.   You may try mixing some Crystal Light or Country Time Lemonade if you prefer. Mix in small amounts; add more if necessary.  Try drinking through a straw  Rinse mouth with water or a mouthwash between glasses, to remove after-taste.  Try sipping on a cold beverage /ice/ popsicles between glasses of prep.  Place a piece of sugar-free hard candy in mouth between glasses.  If you become nauseated, try consuming smaller amounts, or stretch out  the time between glasses. Stop for 30-60 minutes, then slowly start back drinking.        OTHER INSTRUCTIONS  You will need a responsible adult at least 50 years of age to accompany you and drive you home. This person must remain in the waiting room during your procedure. The hospital will cancel your procedure if you do not have a responsible adult with you.   1. Wear loose fitting clothing that is easily removed. 2. Leave jewelry and other valuables at home.  3. Remove all body piercing jewelry and leave at home. 4. Total time from sign-in until discharge is approximately  2-3 hours. 5. You should go home directly after your procedure and rest. You can resume normal activities the day after your procedure. 6. The day of your procedure you should not:  Drive  Make legal decisions  Operate machinery  Drink alcohol  Return to work   You may call the office (Dept: [phone]) before 5:00pm, or page the doctor on call [phone]) after 5:00pm, for further instructions, if necessary.   Insurance Information YOU WILL NEED TO CHECK WITH YOUR INSURANCE COMPANY FOR THE BENEFITS OF COVERAGE YOU HAVE FOR THIS PROCEDURE.  UNFORTUNATELY, NOT ALL INSURANCE COMPANIES HAVE BENEFITS TO COVER ALL OR PART OF THESE TYPES OF PROCEDURES.  IT IS YOUR RESPONSIBILITY TO CHECK YOUR BENEFITS, HOWEVER, WE WILL BE GLAD TO ASSIST YOU WITH ANY CODES YOUR INSURANCE COMPANY MAY NEED.    PLEASE NOTE THAT MOST INSURANCE COMPANIES WILL NOT COVER A SCREENING COLONOSCOPY FOR PEOPLE UNDER THE AGE OF 50  IF YOU HAVE BCBS INSURANCE, YOU MAY HAVE BENEFITS FOR A SCREENING COLONOSCOPY BUT IF POLYPS ARE FOUND THE DIAGNOSIS WILL CHANGE AND THEN YOU MAY HAVE A DEDUCTIBLE THAT WILL NEED TO BE MET. SO PLEASE MAKE SURE YOU CHECK YOUR BENEFITS FOR A SCREENING COLONOSCOPY AS WELL AS A DIAGNOSTIC COLONOSCOPY.

## 2018-12-17 NOTE — Progress Notes (Signed)
Pt would also like to see if we could band her hemorrhoid when she has her procedure.

## 2018-12-17 NOTE — Progress Notes (Addendum)
Gastroenterology Pre-Procedure Review  Request Date: 12/17/2018 Requesting Physician: Evelina Dun, NP @ [facility], no previous TCS  PATIENT REVIEW QUESTIONS: The patient responded to the following health history questions as indicated:    1. Diabetes Melitis: no 2. Joint replacements in the past 12 months: no 3. Major health problems in the past 3 months: no 4. Has an artificial valve or MVP: no 5. Has a defibrillator: no 6. Has been advised in past to take antibiotics in advance of a procedure like teeth cleaning: no 7. Family history of colon cancer: no  8. Alcohol Use: yes, 1 glass of wine and 2 beers on weekend 9. Drug Use: no 10. History of sleep apnea: no  11. History of coronary artery or other vascular stents placed within the last 12 months: no 12. History of any prior anesthesia complications: yes, hard to wake up, hx sinus surgery 13. There is no height or weight on file to calculate BMI. ht: 5'3 wt: 150 lbs    MEDICATIONS & ALLERGIES:    Patient reports the following regarding taking any blood thinners:   Plavix? no Aspirin? no Coumadin? no Brilinta? no Xarelto? no Eliquis? no Pradaxa? no Savaysa? no Effient? no  Patient confirms/reports the following medications:  Current Outpatient Medications  Medication Sig Dispense Refill  . acetaminophen (TYLENOL) 500 MG tablet Take 500 mg by mouth as needed.    . cetirizine (ZYRTEC) 10 MG tablet Take 10 mg by mouth daily.    Marland Kitchen escitalopram (LEXAPRO) 10 MG tablet Take 1 tablet (10 mg total) by mouth daily. 90 tablet 4  . TRI-PREVIFEM 0.18/0.215/0.25 MG-35 MCG tablet TAKE 1 TABLET BY MOUTH EVERY DAY 84 tablet 3  . triamcinolone cream (KENALOG) 0.1 % Apply 1 application topically as needed (rash).     Marland Kitchen albuterol (PROVENTIL HFA;VENTOLIN HFA) 108 (90 Base) MCG/ACT inhaler Inhale 2 puffs into the lungs every 6 (six) hours as needed for wheezing or shortness of breath. (Patient not taking: Reported on 12/17/2018) 1 Inhaler 2  .  hydrOXYzine (VISTARIL) 25 MG capsule Take 1 capsule (25 mg total) by mouth every 8 (eight) hours as needed. (Patient not taking: Reported on 12/17/2018) 270 capsule 2  . ibuprofen (ADVIL,MOTRIN) 200 MG tablet Take 200-400 mg by mouth as needed for headache.     . meclizine (ANTIVERT) 25 MG tablet Take 1 tablet (25 mg total) by mouth 3 (three) times daily as needed for dizziness. (Patient not taking: Reported on 12/17/2018) 90 tablet 1   No current facility-administered medications for this visit.     Patient confirms/reports the following allergies:  Allergies  Allergen Reactions  . Amoxicillin-Pot Clavulanate Other (See Comments)    Nausea , vomitting and diarrhea  . Penicillins Diarrhea and Nausea Only    Has patient had a PCN reaction causing immediate rash, facial/tongue/throat swelling, SOB or lightheadedness with hypotension: No Has patient had a PCN reaction causing severe rash involving mucus membranes or skin necrosis: No Has patient had a PCN reaction that required hospitalization: No Has patient had a PCN reaction occurring within the last 10 years: No If all of the above answers are "NO", then may proceed with Cephalosporin use.     No orders of the defined types were placed in this encounter.   AUTHORIZATION INFORMATION Primary Insurance: [place],  [place] #: [id number],  Group #: [id number] Pre-Cert / Josem Kaufmann required: Yes, approved per Marene Lenz / Auth #: [id number]  SCHEDULE INFORMATION: Procedure has been scheduled as follows:  Date: 03/07/2019, Time:  10:30  Location: [facility] with Dr. Oneida Alar  This Gastroenterology Pre-Precedure Review Form is being routed to the following provider(s): Walden Field, NP

## 2018-12-18 NOTE — Progress Notes (Signed)
Ok to schedule.

## 2018-12-19 NOTE — Addendum Note (Signed)
Addended by: Metro Kung on: 12/19/2018 12:18 PM   Modules accepted: Orders, SmartSet

## 2019-02-25 ENCOUNTER — Telehealth: Payer: Self-pay | Admitting: *Deleted

## 2019-02-25 NOTE — Telephone Encounter (Addendum)
Called pt and informed her that SLF had a death in her family and that we needed to reschedule her procedure.  Pt rescheduled her procedure to 07/03/2019.  Pt is aware that I will mail her out new prep instructions.  Pt voiced understanding.  Endo notified.

## 2019-02-25 NOTE — Progress Notes (Addendum)
Spoke with Estill Bamberg (customer service rep) to inform her that pt's procedure date had changed to 07/03/2019.  She approved authorization from 12/19/2018 to 07/25/2019.  Same reference number as listed below.

## 2019-03-04 ENCOUNTER — Encounter: Payer: Self-pay | Admitting: Pulmonary Disease

## 2019-03-04 ENCOUNTER — Other Ambulatory Visit: Payer: Self-pay

## 2019-03-04 ENCOUNTER — Ambulatory Visit (INDEPENDENT_AMBULATORY_CARE_PROVIDER_SITE_OTHER): Payer: Commercial Managed Care - PPO | Admitting: Pulmonary Disease

## 2019-03-04 DIAGNOSIS — D869 Sarcoidosis, unspecified: Secondary | ICD-10-CM | POA: Diagnosis not present
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
## 2019-03-05 ENCOUNTER — Other Ambulatory Visit (HOSPITAL_COMMUNITY): Payer: Commercial Managed Care - PPO

## 2019-03-07 NOTE — Patient Instructions (Signed)
I am glad you are doing well with regard to your breathing Follow-up in 1 year.

## 2019-03-10 NOTE — Progress Notes (Signed)
Recall entered for 1 year follow up

## 2019-03-13 ENCOUNTER — Ambulatory Visit: Payer: Commercial Managed Care - PPO | Admitting: Pulmonary Disease

## 2019-07-01 ENCOUNTER — Other Ambulatory Visit (HOSPITAL_COMMUNITY)
Admission: RE | Admit: 2019-07-01 | Discharge: 2019-07-01 | Disposition: A | Discharge status: Home / Self Care | Payer: Commercial Managed Care - PPO | Source: Ambulatory Visit | Attending: Gastroenterology | Admitting: Gastroenterology

## 2019-07-01 ENCOUNTER — Other Ambulatory Visit: Payer: Self-pay

## 2019-07-01 ENCOUNTER — Other Ambulatory Visit (HOSPITAL_COMMUNITY): Payer: Commercial Managed Care - PPO

## 2019-07-01 DIAGNOSIS — Z01812 Encounter for preprocedural laboratory examination: Secondary | ICD-10-CM | POA: Diagnosis not present

## 2019-07-01 DIAGNOSIS — Z20822 Contact with and (suspected) exposure to covid-19: Secondary | ICD-10-CM | POA: Insufficient documentation

## 2019-07-02 LAB — SARS CORONAVIRUS 2 (TAT 6-24 HRS): SARS Coronavirus 2: NEGATIVE

## 2019-07-03 ENCOUNTER — Ambulatory Visit (HOSPITAL_COMMUNITY)
Admission: RE | Admit: 2019-07-03 | Discharge: 2019-07-03 | Disposition: A | Discharge status: Home / Self Care | Payer: Commercial Managed Care - PPO | Attending: Gastroenterology | Admitting: Gastroenterology

## 2019-07-03 ENCOUNTER — Encounter (HOSPITAL_COMMUNITY)
Admission: RE | Disposition: A | Discharge status: Home / Self Care | Payer: Self-pay | Source: Home / Self Care | Attending: Gastroenterology

## 2019-07-03 DIAGNOSIS — Q438 Other specified congenital malformations of intestine: Secondary | ICD-10-CM | POA: Insufficient documentation

## 2019-07-03 DIAGNOSIS — Z8249 Family history of ischemic heart disease and other diseases of the circulatory system: Secondary | ICD-10-CM | POA: Insufficient documentation

## 2019-07-03 DIAGNOSIS — Z79899 Other long term (current) drug therapy: Secondary | ICD-10-CM | POA: Diagnosis not present

## 2019-07-03 DIAGNOSIS — R519 Headache, unspecified: Secondary | ICD-10-CM | POA: Insufficient documentation

## 2019-07-03 DIAGNOSIS — D123 Benign neoplasm of transverse colon: Secondary | ICD-10-CM | POA: Insufficient documentation

## 2019-07-03 DIAGNOSIS — R0600 Dyspnea, unspecified: Secondary | ICD-10-CM | POA: Diagnosis not present

## 2019-07-03 DIAGNOSIS — Z803 Family history of malignant neoplasm of breast: Secondary | ICD-10-CM | POA: Diagnosis not present

## 2019-07-03 DIAGNOSIS — Z880 Allergy status to penicillin: Secondary | ICD-10-CM | POA: Insufficient documentation

## 2019-07-03 DIAGNOSIS — F419 Anxiety disorder, unspecified: Secondary | ICD-10-CM | POA: Diagnosis not present

## 2019-07-03 DIAGNOSIS — K635 Polyp of colon: Secondary | ICD-10-CM

## 2019-07-03 DIAGNOSIS — Z1211 Encounter for screening for malignant neoplasm of colon: Secondary | ICD-10-CM | POA: Insufficient documentation

## 2019-07-03 DIAGNOSIS — K644 Residual hemorrhoidal skin tags: Secondary | ICD-10-CM | POA: Diagnosis not present

## 2019-07-03 DIAGNOSIS — K648 Other hemorrhoids: Secondary | ICD-10-CM | POA: Insufficient documentation

## 2019-07-03 HISTORY — PX: POLYPECTOMY: SHX5525

## 2019-07-03 HISTORY — PX: COLONOSCOPY: SHX5424

## 2019-07-03 SURGERY — COLONOSCOPY
Anesthesia: Moderate Sedation

## 2019-07-03 MED ORDER — MIDAZOLAM HCL 5 MG/5ML IJ SOLN
INTRAMUSCULAR | Status: DC | PRN
  Administered 2019-07-03: 1 mg via INTRAVENOUS
  Administered 2019-07-03 (×3): 2 mg via INTRAVENOUS

## 2019-07-03 MED ORDER — SODIUM CHLORIDE 0.9 % IV SOLN: INTRAVENOUS | Status: DC

## 2019-07-03 MED ORDER — MEPERIDINE HCL 100 MG/ML IJ SOLN
INTRAMUSCULAR | Status: AC
  Filled 2019-07-03: qty 2

## 2019-07-03 MED ORDER — MEPERIDINE HCL 100 MG/ML IJ SOLN
INTRAMUSCULAR | Status: DC | PRN
  Administered 2019-07-03 (×3): 25 mg via INTRAVENOUS

## 2019-07-03 MED ORDER — ONDANSETRON HCL 4 MG/2ML IJ SOLN
INTRAMUSCULAR | Status: DC | PRN
  Administered 2019-07-03: 4 mg via INTRAVENOUS

## 2019-07-03 MED ORDER — ONDANSETRON HCL 4 MG/2ML IJ SOLN
INTRAMUSCULAR | Status: AC
  Filled 2019-07-03: qty 2

## 2019-07-03 MED ORDER — STERILE WATER FOR IRRIGATION IR SOLN
Status: DC | PRN
  Administered 2019-07-03: 2.5 mL

## 2019-07-03 MED ORDER — MIDAZOLAM HCL 5 MG/5ML IJ SOLN
INTRAMUSCULAR | Status: AC
  Filled 2019-07-03: qty 10

## 2019-07-03 NOTE — Discharge Instructions (Signed)
You had 1 polyp removed. You have small internal and modertae exterbal hemorrhoids.   DRINK WATER TO KEEP YOUR URINE LIGHT YELLOW.   FOLLOW A HIGH FIBER DIET. AVOID ITEMS THAT CAUSE BLOATING & GAS. SEE INFO BELOW.   USE PREPARATION H FOUR TIMES  A DAY for 7 days then WHEN NEEDED TO RELIEVE RECTAL PAIN/PRESSURE/BLEEDING.   YOUR BIOPSY RESULTS WILL BE BACK IN 5 BUSINESS DAYS.  YOUR next colonoscopy WILL BE SCHEDULED BASED ON YOUR FINAL PATHOLOGY REPORT.    Colonoscopy Care After Read the instructions outlined below and refer to this sheet in the next week. These discharge instructions provide you with general information on caring for yourself after you leave the hospital. While your treatment has been planned according to the most current medical practices available, unavoidable complications occasionally occur. If you have any problems or questions after discharge, call DR. FIELDS, [phone].  ACTIVITY  You may resume your regular activity, but move at a slower pace for the next 24 hours.   Take frequent rest periods for the next 24 hours.   Walking will help get rid of the air and reduce the bloated feeling in your belly (abdomen).   No driving for 24 hours (because of the medicine (anesthesia) used during the test).   You may shower.   Do not sign any important legal documents or operate any machinery for 24 hours (because of the anesthesia used during the test).    NUTRITION  Drink plenty of fluids.   You may resume your normal diet as instructed by your doctor.   Begin with a light meal and progress to your normal diet. Heavy or fried foods are harder to digest and may make you feel sick to your stomach (nauseated).   Avoid alcoholic beverages for 24 hours or as instructed.    MEDICATIONS  You may resume your normal medications.   WHAT YOU CAN EXPECT TODAY  Some feelings of bloating in the abdomen.   Passage of more gas than usual.   Spotting of blood  in your stool or on the toilet paper  .  IF YOU HAD POLYPS REMOVED DURING THE COLONOSCOPY:  Eat a soft diet IF YOU HAVE NAUSEA, BLOATING, ABDOMINAL PAIN, OR VOMITING.    FINDING OUT THE RESULTS OF YOUR TEST Not all test results are available during your visit. DR. Oneida Alar WILL CALL YOU WITHIN 14 DAYS OF YOUR PROCEDUE WITH YOUR RESULTS. Do not assume everything is normal if you have not heard from DR. FIELDS, CALL HER OFFICE AT [phone].  SEEK IMMEDIATE MEDICAL ATTENTION AND CALL THE OFFICE: [phone] IF:  You have more than a spotting of blood in your stool.   Your belly is swollen (abdominal distention).   You are nauseated or vomiting.   You have a temperature over 101F.   You have abdominal pain or discomfort that is severe or gets worse throughout the day.   High-Fiber Diet A high-fiber diet changes your normal diet to include more whole grains, legumes, fruits, and vegetables. Changes in the diet involve replacing refined carbohydrates with unrefined foods. The calorie level of the diet is essentially unchanged. The Dietary Reference Intake (recommended amount) for adult males is 38 grams per day. For adult females, it is 25 grams per day. Pregnant and lactating women should consume 28 grams of fiber per day. Fiber is the intact part of a plant that is not broken down during digestion. Functional fiber is fiber that has been isolated from the  plant to provide a beneficial effect in the body.  PURPOSE  Increase stool bulk.   Ease and regulate bowel movements.   Lower cholesterol.   REDUCE RISK OF COLON CANCER  INDICATIONS THAT YOU NEED MORE FIBER  Constipation and hemorrhoids.   Uncomplicated diverticulosis (intestine condition) and irritable bowel syndrome.   Weight management.   As a protective measure against hardening of the arteries (atherosclerosis), diabetes, and cancer.   GUIDELINES FOR INCREASING FIBER IN THE DIET  Start adding fiber to the diet  slowly. A gradual increase of about 5 more grams (2 servings of most fruits or vegetables) per day is best. Too rapid an increase in fiber may result in constipation, flatulence, and bloating.   Drink enough water and fluids to keep your urine clear or pale yellow. Water, juice, or caffeine-free drinks are recommended. Not drinking enough fluid may cause constipation.   Eat a variety of high-fiber foods rather than one type of fiber.   Try to increase your intake of fiber through using high-fiber foods rather than fiber pills or supplements that contain small amounts of fiber.   The goal is to change the types of food eaten. Do not supplement your present diet with high-fiber foods, but replace foods in your present diet.    Polyps, Colon  A polyp is extra tissue that grows inside your body. Colon polyps grow in the large intestine. The large intestine, also called the colon, is part of your digestive system. It is a long, hollow tube at the end of your digestive tract where your body makes and stores stool. Most polyps are not dangerous. They are benign. This means they are not cancerous. But over time, some types of polyps can turn into cancer. Polyps that are smaller than a pea are usually not harmful. But larger polyps could someday become or may already be cancerous. To be safe, doctors remove all polyps and test them.   WHO GETS POLYPS? Anyone can get polyps, but certain people are more likely than others. You may have a greater chance of getting polyps if:  You are over 50.   You have had polyps before.   Someone in your family has had polyps.   Someone in your family has had cancer of the large intestine.   Find out if someone in your family has had polyps. You may also be more likely to get polyps if you:   Eat a lot of fatty foods   Smoke   Drink alcohol   Do not exercise  Eat too much   PREVENTION There is not one sure way to prevent polyps. You might be able to lower  your risk of getting them if you:  Eat more fruits and vegetables and less fatty food.   Do not smoke.   Avoid alcohol.   Exercise every day.   Lose weight if you are overweight.   Eating more calcium and folate can also lower your risk of getting polyps. Some foods that are rich in calcium are milk, cheese, and broccoli. Some foods that are rich in folate are chickpeas, kidney beans, and spinach.   Hemorrhoids Hemorrhoids are dilated (enlarged) veins around the rectum. Sometimes clots will form in the veins. This makes them swollen and painful. These are called thrombosed hemorrhoids. Causes of hemorrhoids include:  Constipation.   Straining to have a bowel movement.   HEAVY LIFTING  HOME CARE INSTRUCTIONS  Eat a well balanced diet and drink 6 to 8  glasses of water every day to avoid constipation. You may also use a bulk laxative.   Avoid straining to have bowel movements.   Keep anal area dry and clean.   Do not use a donut shaped pillow or sit on the toilet for long periods. This increases blood pooling and pain.   Move your bowels when your body has the urge; this will require less straining and will decrease pain and pressure.

## 2019-07-03 NOTE — Op Note (Signed)
[facility] Patient Name: Brenda Walker Procedure Date: 07/03/2019 8:27 AM MRN: [id number] Date of Birth: [birth year] Attending MD: Barney Drain MD, MD CSN: [id number] Age: 49 Admit Type: Outpatient Procedure:                Colonoscopy WITH COLD SNARE POLYPECTOMY Indications:              Screening for colorectal malignant neoplasm Providers:                Barney Drain MD, MD, Lurline Del, RN, Randa Spike, Technician Referring MD:             Theador Hawthorne. Hawks Medicines:                Ondansetron 4 mg IV, Meperidine 75 mg IV, Midazolam                            7 mg IV Complications:            No immediate complications. Estimated Blood Loss:     Estimated blood loss was minimal. Procedure:                Pre-Anesthesia Assessment:                           - Prior to the procedure, a History and Physical                            was performed, and patient medications and                            allergies were reviewed. The patient's tolerance of                            previous anesthesia was also reviewed. The risks                            and benefits of the procedure and the sedation                            options and risks were discussed with the patient.                            All questions were answered, and informed consent                            was obtained. Prior Anticoagulants: The patient has                            taken no previous anticoagulant or antiplatelet                            agents. ASA Grade Assessment: II - A patient with  mild systemic disease. After reviewing the risks                            and benefits, the patient was deemed in                            satisfactory condition to undergo the procedure.                            After obtaining informed consent, the colonoscope                            was passed under direct vision. Throughout the                       procedure, the patient's blood pressure, pulse, and                            oxygen saturations were monitored continuously. The                            [id number]) was introduced through the                            anus and advanced to the the cecum, identified by                            appendiceal orifice and ileocecal valve. The                            colonoscopy was somewhat difficult due to                            restricted mobility of the colon, a tortuous colon                            and the patient's discomfort during the procedure.                            Successful completion of the procedure was aided by                            increasing the dose of sedation medication,                            straightening and shortening the scope to obtain                            bowel loop reduction and COLOWRAP. The patient                            tolerated the procedure fairly well. The quality of  the bowel preparation was excellent. The ileocecal                            valve, appendiceal orifice, and rectum were                            photographed. Scope In: 8:58:10 AM Scope Out: 9:16:24 AM Scope Withdrawal Time: 0 hours 12 minutes 49 seconds  Total Procedure Duration: 0 hours 18 minutes 14 seconds  Findings:      A 4 mm polyp was found in the splenic flexure. The polyp was sessile.       The polyp was removed with a cold snare. Resection and retrieval were       complete.      Internal hemorrhoids were found. The hemorrhoids were small.      External hemorrhoids were found. The hemorrhoids were moderate.      The recto-sigmoid colon, sigmoid colon and descending colon were       moderately tortuous. Impression:               - One 4 mm polyp at the splenic flexure, removed                            with a cold snare. Resected and retrieved.                           - Internal  hemorrhoids.                           - External hemorrhoids.                           - Tortuous colon. Moderate Sedation:      Moderate (conscious) sedation was administered by the endoscopy nurse       and supervised by the endoscopist. The following parameters were       monitored: oxygen saturation, heart rate, blood pressure, and response       to care. Total physician intraservice time was 36 minutes. Recommendation:           - Patient has a contact number available for                            emergencies. The signs and symptoms of potential                            delayed complications were discussed with the                            patient. Return to normal activities tomorrow.                            Written discharge instructions were provided to the                            patient.                           -  High fiber diet.                           - Continue present medications.                           - Await pathology results.                           - Repeat colonoscopy WITH PEDS COLONOSCOPE after                            pending pathology results are reviewed for                            surveillance. CONSIDER PROPOFOL. Procedure Code(s):        --- Professional ---                           [phone], Colonoscopy, flexible; with removal of                            tumor(s), polyp(s), or other lesion(s) by snare                            technique                           99153, Moderate sedation; each additional 15                            minutes intraservice time                           G0500, Moderate sedation services provided by the                            same physician or other qualified health care                            professional performing a gastrointestinal                            endoscopic service that sedation supports,                            requiring the presence of an independent trained                             observer to assist in the monitoring of the                            patient's level of consciousness and physiological                            status; initial 15 minutes of intra-service time;  patient age 51 years or older (additional time may                            be reported with [phone], as appropriate) Diagnosis Code(s):        --- Professional ---                           Z12.11, Encounter for screening for malignant                            neoplasm of colon                           K63.5, Polyp of colon                           K64.4, Residual hemorrhoidal skin tags                           K64.8, Other hemorrhoids                           Q43.8, Other specified congenital malformations of                            intestine CPT copyright 2019 American Medical Association. All rights reserved. The codes documented in this report are preliminary and upon coder review may  be revised to meet current compliance requirements. Barney Drain, MD Barney Drain MD, MD 07/03/2019 9:33:43 AM This report has been signed electronically. Number of Addenda: 0
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
## 2019-07-04 LAB — SURGICAL PATHOLOGY

## 2019-07-15 ENCOUNTER — Telehealth: Payer: Self-pay | Admitting: *Deleted

## 2019-07-15 NOTE — Telephone Encounter (Signed)
Pt would like LL (nurse) to call her back when she gets a chance.  [phone]

## 2019-07-16 ENCOUNTER — Other Ambulatory Visit: Payer: Self-pay | Admitting: *Deleted

## 2019-07-16 DIAGNOSIS — K649 Unspecified hemorrhoids: Secondary | ICD-10-CM

## 2019-07-16 NOTE — Telephone Encounter (Signed)
Returned call, see lab results

## 2019-07-29 ENCOUNTER — Encounter: Payer: Self-pay | Admitting: General Surgery

## 2019-07-29 ENCOUNTER — Other Ambulatory Visit: Payer: Self-pay

## 2019-07-29 ENCOUNTER — Ambulatory Visit: Payer: Commercial Managed Care - PPO | Admitting: General Surgery

## 2019-07-29 VITALS — BP 129/85 | HR 86 | Temp 98.2°F | Resp 12 | Ht 63.0 in | Wt 165.0 lb

## 2019-07-29 DIAGNOSIS — K649 Unspecified hemorrhoids: Secondary | ICD-10-CM | POA: Diagnosis not present
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
## 2019-07-29 NOTE — Patient Instructions (Signed)

## 2019-07-31 DIAGNOSIS — K642 Third degree hemorrhoids: Secondary | ICD-10-CM | POA: Insufficient documentation

## 2019-08-05 ENCOUNTER — Ambulatory Visit: Payer: Commercial Managed Care - PPO | Admitting: Gastroenterology

## 2019-08-27 ENCOUNTER — Other Ambulatory Visit: Payer: Self-pay | Admitting: Family

## 2019-08-27 DIAGNOSIS — Z1231 Encounter for screening mammogram for malignant neoplasm of breast: Secondary | ICD-10-CM

## 2019-09-22 ENCOUNTER — Other Ambulatory Visit: Payer: Self-pay

## 2019-09-22 ENCOUNTER — Ambulatory Visit
Admission: RE | Admit: 2019-09-22 | Discharge: 2019-09-22 | Disposition: A | Discharge status: Home / Self Care | Payer: Commercial Managed Care - PPO | Source: Ambulatory Visit | Attending: Family | Admitting: Family

## 2019-09-22 DIAGNOSIS — Z1231 Encounter for screening mammogram for malignant neoplasm of breast: Secondary | ICD-10-CM

## 2019-10-25 ENCOUNTER — Other Ambulatory Visit: Payer: Self-pay | Admitting: Family

## 2019-10-25 DIAGNOSIS — R519 Headache, unspecified: Secondary | ICD-10-CM

## 2019-10-25 DIAGNOSIS — G44009 Cluster headache syndrome, unspecified, not intractable: Secondary | ICD-10-CM

## 2019-11-17 ENCOUNTER — Other Ambulatory Visit: Payer: Self-pay | Admitting: Family

## 2019-11-17 DIAGNOSIS — G44009 Cluster headache syndrome, unspecified, not intractable: Secondary | ICD-10-CM

## 2019-11-17 DIAGNOSIS — R519 Headache, unspecified: Secondary | ICD-10-CM

## 2019-11-18 NOTE — Telephone Encounter (Signed)
lmtcb to schedule follow up for further refills.

## 2019-11-18 NOTE — Telephone Encounter (Signed)
[facility] NTBS 30 days given 10/27/19 Last PE 10/2018

## 2019-11-28 ENCOUNTER — Other Ambulatory Visit: Payer: Self-pay

## 2019-11-28 ENCOUNTER — Encounter: Payer: Self-pay | Admitting: Family

## 2019-11-28 ENCOUNTER — Ambulatory Visit (INDEPENDENT_AMBULATORY_CARE_PROVIDER_SITE_OTHER): Payer: Commercial Managed Care - PPO | Admitting: Family

## 2019-11-28 VITALS — BP 139/89 | HR 83 | Temp 96.6°F | Ht 63.0 in | Wt 145.4 lb

## 2019-11-28 DIAGNOSIS — Z0000 Encounter for general adult medical examination without abnormal findings: Secondary | ICD-10-CM

## 2019-11-28 DIAGNOSIS — G44009 Cluster headache syndrome, unspecified, not intractable: Secondary | ICD-10-CM

## 2019-11-28 DIAGNOSIS — Z1159 Encounter for screening for other viral diseases: Secondary | ICD-10-CM

## 2019-11-28 DIAGNOSIS — R519 Headache, unspecified: Secondary | ICD-10-CM | POA: Diagnosis not present

## 2019-11-28 DIAGNOSIS — Z0001 Encounter for general adult medical examination with abnormal findings: Secondary | ICD-10-CM

## 2019-11-28 DIAGNOSIS — Z3009 Encounter for other general counseling and advice on contraception: Secondary | ICD-10-CM

## 2019-11-28 DIAGNOSIS — F411 Generalized anxiety disorder: Secondary | ICD-10-CM

## 2019-11-28 MED ORDER — BUSPIRONE HCL 5 MG PO TABS: 5.0000 mg | ORAL_TABLET | Freq: Three times a day (TID) | ORAL | 1 refills | Status: DC | PRN

## 2019-11-28 MED ORDER — ESCITALOPRAM OXALATE 10 MG PO TABS: 10.0000 mg | ORAL_TABLET | Freq: Every day | ORAL | 4 refills | Status: DC

## 2019-11-28 MED ORDER — NORGESTIM-ETH ESTRAD TRIPHASIC 0.18/0.215/0.25 MG-35 MCG PO TABS: 1.0000 | ORAL_TABLET | Freq: Every day | ORAL | 4 refills | Status: DC

## 2019-11-28 MED ORDER — TRIAMCINOLONE ACETONIDE 0.1 % EX CREA: 1.0000 "application " | TOPICAL_CREAM | CUTANEOUS | 1 refills | Status: DC | PRN

## 2019-11-28 NOTE — Progress Notes (Signed)
Subjective:    Patient ID: Brenda Walker, female    DOB: 07/26/1970, 49 y.o.   MRN: [id number]  Chief Complaint  Patient presents with  . Medical Management of Chronic Issues   PT presents to the office today for CPE without pap. She is followed by Pulmonologist annually for sarcoidosis. States her breathing is stable.   She continues to take OC. She is not obese and does not smoke. Continues to have a period every month with 4 days of light bleeding.  Anxiety Symptoms include depressed mood, excessive worry, irritability and restlessness. Symptoms occur occasionally. The severity of symptoms is moderate.        Review of Systems  Constitutional: Positive for irritability.  All other systems reviewed and are negative.  Family History  Problem Relation Age of Onset  . Hypertension Sister   . Heart disease Brother 30       MI  . Breast cancer Sister   . Breast cancer Paternal Aunt    Social History   Socioeconomic History  . Marital status: Married    Spouse name: Not on file  . Number of children: Not on file  . Years of education: Not on file  . Highest education level: Not on file  Occupational History  . Not on file  Tobacco Use  . Smoking status: Never Smoker  . Smokeless tobacco: Never Used  Vaping Use  . Vaping Use: Never used  Substance and Sexual Activity  . Alcohol use: Yes    Comment: 1 glass of wine and 2 beers on weekend  . Drug use: No  . Sexual activity: Yes    Birth control/protection: Pill    Comment: OTC  Other Topics Concern  . Not on file  Social History Narrative   ** Merged History Encounter **       Social Determinants of Health   Financial Resource Strain:   . Difficulty of Paying Living Expenses: Not on file  Food Insecurity:   . Worried About Charity fundraiser in the Last Year: Not on file  . Ran Out of Food in the Last Year: Not on file  Transportation Needs:   . Lack of Transportation (Medical): Not on file  . Lack  of Transportation (Non-Medical): Not on file  Physical Activity:   . Days of Exercise per Week: Not on file  . Minutes of Exercise per Session: Not on file  Stress:   . Feeling of Stress : Not on file  Social Connections:   . Frequency of Communication with Friends and Family: Not on file  . Frequency of Social Gatherings with Friends and Family: Not on file  . Attends Religious Services: Not on file  . Active Member of Clubs or Organizations: Not on file  . Attends Archivist Meetings: Not on file  . Marital Status: Not on file       Objective:   Physical Exam Vitals reviewed.  Constitutional:      General: She is not in acute distress.    Appearance: She is well-developed.  HENT:     Head: Normocephalic and atraumatic.     Right Ear: Tympanic membrane normal.     Left Ear: Tympanic membrane normal.  Eyes:     Pupils: Pupils are equal, round, and reactive to light.  Neck:     Thyroid: No thyromegaly.  Cardiovascular:     Rate and Rhythm: Normal rate and regular rhythm.     Heart  sounds: Normal heart sounds. No murmur heard.   Pulmonary:     Effort: Pulmonary effort is normal. No respiratory distress.     Breath sounds: Normal breath sounds. No wheezing.  Abdominal:     General: Bowel sounds are normal. There is no distension.     Palpations: Abdomen is soft.     Tenderness: There is no abdominal tenderness.  Musculoskeletal:        General: No tenderness. Normal range of motion.     Cervical back: Normal range of motion and neck supple.  Skin:    General: Skin is warm and dry.  Neurological:     Mental Status: She is alert and oriented to person, place, and time.     Cranial Nerves: No cranial nerve deficit.     Deep Tendon Reflexes: Reflexes are normal and symmetric.  Psychiatric:        Behavior: Behavior normal.        Thought Content: Thought content normal.        Judgment: Judgment normal.     BP 139/89   Pulse 83   Temp (!) 96.6 F (35.9  C) (Temporal)   Ht _0  (1.6 m)   Wt 145 lb 6.4 oz (66 kg)   SpO2 100%   BMI 25.76 kg/m       Assessment & Plan:  Brenda Walker comes in today with chief complaint of Medical Management of Chronic Issues   Diagnosis and orders addressed:  1. Generalized anxiety disorder Will add Buspar 5 mg TID prn  Stress management  - escitalopram (LEXAPRO) 10 MG tablet; Take 1 tablet (10 mg total) by mouth daily.  Dispense: 90 tablet; Refill: 4 - CMP14+EGFR - CBC with Differential/Platelet  2. Nonintractable headache, unspecified chronicity pattern, unspecified headache type - Norgestimate-Ethinyl Estradiol Triphasic (TRI-ESTARYLLA) 0.18/0.215/0.25 MG-35 MCG tablet; Take 1 tablet by mouth daily. (Needs to be seen before next refill)  Dispense: 90 tablet; Refill: 4 - CMP14+EGFR - CBC with Differential/Platelet  3. Vasomotor headache - Norgestimate-Ethinyl Estradiol Triphasic (TRI-ESTARYLLA) 0.18/0.215/0.25 MG-35 MCG tablet; Take 1 tablet by mouth daily. (Needs to be seen before next refill)  Dispense: 90 tablet; Refill: 4 - CMP14+EGFR - CBC with Differential/Platelet  4. Birth control counseling - Norgestimate-Ethinyl Estradiol Triphasic (TRI-ESTARYLLA) 0.18/0.215/0.25 MG-35 MCG tablet; Take 1 tablet by mouth daily. (Needs to be seen before next refill)  Dispense: 90 tablet; Refill: 4 - CMP14+EGFR - CBC with Differential/Platelet  5. Annual physical exam - CMP14+EGFR - CBC with Differential/Platelet - Lipid panel - TSH - Hepatitis C antibody  6. Need for hepatitis C screening test - CMP14+EGFR - CBC with Differential/Platelet - Hepatitis C antibody   Labs pending Health Maintenance reviewed Diet and exercise encouraged  Follow up plan: 1 year    Evelina Dun, FNP

## 2019-11-28 NOTE — Patient Instructions (Signed)
Health Maintenance, Female Adopting a healthy lifestyle and getting preventive care are important in promoting health and wellness. Ask your health care provider about:  The right schedule for you to have regular tests and exams.  Things you can do on your own to prevent diseases and keep yourself healthy. What should I know about diet, weight, and exercise? Eat a healthy diet   Eat a diet that includes plenty of vegetables, fruits, low-fat dairy products, and lean protein.  Do not eat a lot of foods that are high in solid fats, added sugars, or sodium. Maintain a healthy weight Body mass index (BMI) is used to identify weight problems. It estimates body fat based on height and weight. Your health care provider can help determine your BMI and help you achieve or maintain a healthy weight. Get regular exercise Get regular exercise. This is one of the most important things you can do for your health. Most adults should:  Exercise for at least 150 minutes each week. The exercise should increase your heart rate and make you sweat (moderate-intensity exercise).  Do strengthening exercises at least twice a week. This is in addition to the moderate-intensity exercise.  Spend less time sitting. Even light physical activity can be beneficial. Watch cholesterol and blood lipids Have your blood tested for lipids and cholesterol at 51 years of age, then have this test every 5 years. Have your cholesterol levels checked more often if:  Your lipid or cholesterol levels are high.  You are older than 51 years of age.  You are at high risk for heart disease. What should I know about cancer screening? Depending on your health history and family history, you may need to have cancer screening at various ages. This may include screening for:  Breast cancer.  Cervical cancer.  Colorectal cancer.  Skin cancer.  Lung cancer. What should I know about heart disease, diabetes, and high blood  pressure? Blood pressure and heart disease  High blood pressure causes heart disease and increases the risk of stroke. This is more likely to develop in people who have high blood pressure readings, are of African descent, or are overweight.  Have your blood pressure checked: ? Every 3-5 years if you are 18-39 years of age. ? Every year if you are 40 years old or older. Diabetes Have regular diabetes screenings. This checks your fasting blood sugar level. Have the screening done:  Once every three years after age 40 if you are at a normal weight and have a low risk for diabetes.  More often and at a younger age if you are overweight or have a high risk for diabetes. What should I know about preventing infection? Hepatitis B If you have a higher risk for hepatitis B, you should be screened for this virus. Talk with your health care provider to find out if you are at risk for hepatitis B infection. Hepatitis C Testing is recommended for:  Everyone born from 1945 through 1965.  Anyone with known risk factors for hepatitis C. Sexually transmitted infections (STIs)  Get screened for STIs, including gonorrhea and chlamydia, if: ? You are sexually active and are younger than 51 years of age. ? You are older than 51 years of age and your health care provider tells you that you are at risk for this type of infection. ? Your sexual activity has changed since you were last screened, and you are at increased risk for chlamydia or gonorrhea. Ask your health care provider if   you are at risk.  Ask your health care provider about whether you are at high risk for HIV. Your health care provider may recommend a prescription medicine to help prevent HIV infection. If you choose to take medicine to prevent HIV, you should first get tested for HIV. You should then be tested every 3 months for as long as you are taking the medicine. Pregnancy  If you are about to stop having your period (premenopausal) and  you may become pregnant, seek counseling before you get pregnant.  Take 400 to 800 micrograms (mcg) of folic acid every day if you become pregnant.  Ask for birth control (contraception) if you want to prevent pregnancy. Osteoporosis and menopause Osteoporosis is a disease in which the bones lose minerals and strength with aging. This can result in bone fractures. If you are 65 years old or older, or if you are at risk for osteoporosis and fractures, ask your health care provider if you should:  Be screened for bone loss.  Take a calcium or vitamin D supplement to lower your risk of fractures.  Be given hormone replacement therapy (HRT) to treat symptoms of menopause. Follow these instructions at home: Lifestyle  Do not use any products that contain nicotine or tobacco, such as cigarettes, e-cigarettes, and chewing tobacco. If you need help quitting, ask your health care provider.  Do not use street drugs.  Do not share needles.  Ask your health care provider for help if you need support or information about quitting drugs. Alcohol use  Do not drink alcohol if: ? Your health care provider tells you not to drink. ? You are pregnant, may be pregnant, or are planning to become pregnant.  If you drink alcohol: ? Limit how much you use to 0-1 drink a day. ? Limit intake if you are breastfeeding.  Be aware of how much alcohol is in your drink. In the [place] one drink equals one 12 oz bottle of beer (355 mL), one 5 oz glass of wine (148 mL), or one 1 oz glass of hard liquor (44 mL). General instructions  Schedule regular health, dental, and eye exams.  Stay current with your vaccines.  Tell your health care provider if: ? You often feel depressed. ? You have ever been abused or do not feel safe at home. Summary  Adopting a healthy lifestyle and getting preventive care are important in promoting health and wellness.  Follow your health care provider's instructions about healthy  diet, exercising, and getting tested or screened for diseases.  Follow your health care provider's instructions on monitoring your cholesterol and blood pressure. This information is not intended to replace advice given to you by your health care provider. Make sure you discuss any questions you have with your health care provider. Document Revised: 03/06/2018 Document Reviewed: 03/06/2018 Elsevier Patient Education  2020 [employer] Inc.  

## 2019-11-29 LAB — CBC WITH DIFFERENTIAL/PLATELET
Basophils Absolute: 0 10*3/uL (ref 0.0–0.2)
Basos: 1 %
EOS (ABSOLUTE): 0 10*3/uL (ref 0.0–0.4)
Eos: 1 %
Hematocrit: 40.2 % (ref 34.0–46.6)
Hemoglobin: 13 g/dL (ref 11.1–15.9)
Immature Grans (Abs): 0 10*3/uL (ref 0.0–0.1)
Immature Granulocytes: 0 %
Lymphocytes Absolute: 0.8 10*3/uL (ref 0.7–3.1)
Lymphs: 16 %
MCH: 30.3 pg (ref 26.6–33.0)
MCHC: 32.3 g/dL (ref 31.5–35.7)
MCV: 94 fL (ref 79–97)
Monocytes Absolute: 0.5 10*3/uL (ref 0.1–0.9)
Monocytes: 9 %
Neutrophils Absolute: 3.9 10*3/uL (ref 1.4–7.0)
Neutrophils: 73 %
Platelets: 334 10*3/uL (ref 150–450)
RBC: 4.29 x10E6/uL (ref 3.77–5.28)
RDW: 12.7 % (ref 11.7–15.4)
WBC: 5.3 10*3/uL (ref 3.4–10.8)

## 2019-11-29 LAB — CMP14+EGFR
ALT: 23 IU/L (ref 0–32)
AST: 18 IU/L (ref 0–40)
Albumin/Globulin Ratio: 1.6 (ref 1.2–2.2)
Albumin: 4.2 g/dL (ref 3.8–4.9)
Alkaline Phosphatase: 95 IU/L (ref 48–121)
BUN/Creatinine Ratio: 17 (ref 9–23)
BUN: 15 mg/dL (ref 6–24)
Bilirubin Total: <0.2 mg/dL (ref 0.0–1.2)
CO2: 24 mmol/L (ref 20–29)
Calcium: 9.8 mg/dL (ref 8.7–10.2)
Chloride: 98 mmol/L (ref 96–106)
Creatinine, Ser: 0.86 mg/dL (ref 0.57–1.00)
GFR calc Af Amer: 90 mL/min/{1.73_m2} (ref >59)
GFR calc non Af Amer: 78 mL/min/{1.73_m2} (ref >59)
Globulin, Total: 2.7 g/dL (ref 1.5–4.5)
Glucose: 78 mg/dL (ref 65–99)
Potassium: 4.4 mmol/L (ref 3.5–5.2)
Sodium: 135 mmol/L (ref 134–144)
Total Protein: 6.9 g/dL (ref 6.0–8.5)

## 2019-11-29 LAB — HEPATITIS C ANTIBODY: Hep C Virus Ab: <0.1 s/co ratio (ref 0.0–0.9)

## 2019-11-29 LAB — LIPID PANEL
Chol/HDL Ratio: 2.5 ratio (ref 0.0–4.4)
Cholesterol, Total: 187 mg/dL (ref 100–199)
HDL: 74 mg/dL (ref >39)
LDL Chol Calc (NIH): 99 mg/dL (ref 0–99)
Triglycerides: 77 mg/dL (ref 0–149)
VLDL Cholesterol Cal: 14 mg/dL (ref 5–40)

## 2019-11-29 LAB — TSH: TSH: 1.91 u[IU]/mL (ref 0.450–4.500)

## 2019-12-23 ENCOUNTER — Other Ambulatory Visit: Payer: Self-pay | Admitting: Family

## 2020-03-24 ENCOUNTER — Other Ambulatory Visit: Payer: Self-pay | Admitting: Family

## 2020-06-24 ENCOUNTER — Other Ambulatory Visit: Payer: Self-pay | Admitting: Family

## 2020-06-24 NOTE — Telephone Encounter (Signed)
OV 11/28/19 RTC 1 yr

## 2020-12-01 ENCOUNTER — Telehealth: Payer: Self-pay | Admitting: Family

## 2020-12-01 DIAGNOSIS — F411 Generalized anxiety disorder: Secondary | ICD-10-CM

## 2020-12-01 NOTE — Telephone Encounter (Signed)
Pt scheduled an apt for this med refill and is asking if she can get a refill till apt. On 12-13-2020 [facility]. She is aware she may have to wait till apt date. Please call back

## 2020-12-02 ENCOUNTER — Ambulatory Visit (INDEPENDENT_AMBULATORY_CARE_PROVIDER_SITE_OTHER): Payer: Commercial Managed Care - PPO | Admitting: Family Medicine

## 2020-12-02 ENCOUNTER — Encounter: Payer: Self-pay | Admitting: Family Medicine

## 2020-12-02 DIAGNOSIS — U071 COVID-19: Secondary | ICD-10-CM | POA: Diagnosis not present

## 2020-12-02 MED ORDER — PREDNISONE 20 MG PO TABS: 40.0000 mg | ORAL_TABLET | Freq: Every day | ORAL | 0 refills | Status: AC

## 2020-12-02 MED ORDER — ESCITALOPRAM OXALATE 10 MG PO TABS: 10.0000 mg | ORAL_TABLET | Freq: Every day | ORAL | 0 refills | Status: DC

## 2020-12-02 NOTE — Addendum Note (Signed)
Addended by: Ladean Raya on: 12/02/2020 08:36 AM   Modules accepted: Orders

## 2020-12-02 NOTE — Progress Notes (Signed)
   Virtual Visit  Note Due to COVID-19 pandemic this visit was conducted virtually. This visit type was conducted due to national recommendations for restrictions regarding the COVID-19 Pandemic (e.g. social distancing, sheltering in place) in an effort to limit this patient's exposure and mitigate transmission in our community. All issues noted in this document were discussed and addressed.  A physical exam was not performed with this format.  I connected with Brenda Walker on 12/02/20 at 1127 by telephone and verified that I am speaking with the correct person using two identifiers. Brenda Walker is currently located at home and her husband is currently with her during the visit. The provider, Gwenlyn Perking, FNP is located in their office at time of visit.  I discussed the limitations, risks, security and privacy concerns of performing an evaluation and management service by telephone and the availability of in person appointments. I also discussed with the patient that there may be a patient responsible charge related to this service. The patient expressed understanding and agreed to proceed.  CC: Covid   History and Present Illness:  HPI Brenda Walker reports testing positive for Covid 2 nights again. She reports that her symptoms started 4 days ago. She reports sore throat, congestion, body aches, HA, chills, and subjective fever. She denies chest pain, shortness of breath, wheezing, nausea, vomiting, or diarrhea. She has been taking tylenol and ibuprofen for her symptoms. She has been vaccinated.     ROS As per HPI.   Observations/Objective: Alert and oriented x 3. Able to speak in full sentences without difficulty.   Assessment and Plan: Brenda Walker was seen today for covid positive.  Diagnoses and all orders for this visit:  COVID-19 Discussed quarantine, symptomatic care, quarantine, and return precautions. Prednisone burst as below.  -     predniSONE (DELTASONE) 20 MG  tablet; Take 2 tablets (40 mg total) by mouth daily with breakfast for 3 days.    Follow Up Instructions: Return to office for new or worsening symptoms, or if symptoms persist.     I discussed the assessment and treatment plan with the patient. The patient was provided an opportunity to ask questions and all were answered. The patient agreed with the plan and demonstrated an understanding of the instructions.   The patient was advised to call back or seek an in-person evaluation if the symptoms worsen or if the condition fails to improve as anticipated.  The above assessment and management plan was discussed with the patient. The patient verbalized understanding of and has agreed to the management plan. Patient is aware to call the clinic if symptoms persist or worsen. Patient is aware when to return to the clinic for a follow-up visit. Patient educated on when it is appropriate to go to the emergency department.   Time call ended: 1139  I provided 12 minutes of  non face-to-face time during this encounter.    Gwenlyn Perking, FNP

## 2020-12-13 ENCOUNTER — Ambulatory Visit: Payer: Commercial Managed Care - PPO | Admitting: Family

## 2020-12-13 ENCOUNTER — Encounter: Payer: Self-pay | Admitting: Family

## 2020-12-13 DIAGNOSIS — Z3009 Encounter for other general counseling and advice on contraception: Secondary | ICD-10-CM

## 2020-12-13 DIAGNOSIS — D869 Sarcoidosis, unspecified: Secondary | ICD-10-CM

## 2020-12-13 DIAGNOSIS — E663 Overweight: Secondary | ICD-10-CM | POA: Diagnosis not present

## 2020-12-13 DIAGNOSIS — F411 Generalized anxiety disorder: Secondary | ICD-10-CM | POA: Diagnosis not present

## 2020-12-13 DIAGNOSIS — G44009 Cluster headache syndrome, unspecified, not intractable: Secondary | ICD-10-CM

## 2020-12-13 DIAGNOSIS — R519 Headache, unspecified: Secondary | ICD-10-CM

## 2020-12-13 MED ORDER — ESCITALOPRAM OXALATE 10 MG PO TABS: 10.0000 mg | ORAL_TABLET | Freq: Every day | ORAL | 3 refills | Status: DC

## 2020-12-13 MED ORDER — TRIAMCINOLONE ACETONIDE 0.1 % EX CREA: 1.0000 "application " | TOPICAL_CREAM | CUTANEOUS | 1 refills | Status: DC | PRN

## 2020-12-13 MED ORDER — NORGESTIM-ETH ESTRAD TRIPHASIC 0.18/0.215/0.25 MG-35 MCG PO TABS: 1.0000 | ORAL_TABLET | Freq: Every day | ORAL | 4 refills | Status: DC

## 2020-12-13 MED ORDER — BUSPIRONE HCL 5 MG PO TABS: 5.0000 mg | ORAL_TABLET | Freq: Three times a day (TID) | ORAL | 2 refills | Status: DC | PRN

## 2020-12-13 NOTE — Progress Notes (Signed)
Virtual Visit  Note Due to COVID-19 pandemic this visit was conducted virtually. This visit type was conducted due to national recommendations for restrictions regarding the COVID-19 Pandemic (e.g. social distancing, sheltering in place) in an effort to limit this patient's exposure and mitigate transmission in our community. All issues noted in this document were discussed and addressed.  A physical exam was not performed with this format.  I connected with Brenda Walker on 12/13/20 at 11:45 AM  by telephone and verified that I am speaking with the correct person using two identifiers. Brenda Walker is currently located at work and no one is currently with her during visit. The provider, Evelina Dun, FNP is located in their office at time of visit.  I discussed the limitations, risks, security and privacy concerns of performing an evaluation and management service by telephone and the availability of in person appointments. I also discussed with the patient that there may be a patient responsible charge related to this service. The patient expressed understanding and agreed to proceed.   History and Present Illness:  PT calls the office today for chronic follow up.  She is followed by Pulmonologist annually for sarcoidosis. States her breathing is stable, but having slight increase in SOB since COVID.     She continues to take OC. She is not obese and does not smoke. Continues to have a period every month with 4 days of light bleeding.  Anxiety Presents for follow-up visit. Symptoms include excessive worry, irritability, nervous/anxious behavior and restlessness. Symptoms occur most days.      Review of Systems  Constitutional:  Positive for irritability.  Psychiatric/Behavioral:  The patient is nervous/anxious.   All other systems reviewed and are negative.   Observations/Objective: No SOB or distress noted  Assessment and Plan: 1. Generalized anxiety disorder Continue  Lexapro and Buspar daily Stress management  - escitalopram (LEXAPRO) 10 MG tablet; Take 1 tablet (10 mg total) by mouth daily.  Dispense: 90 tablet; Refill: 3  2. Overweight (BMI 25.0-29.9)  3. Sarcoidosis  4. Nonintractable headache, unspecified chronicity pattern, unspecified headache type - Norgestimate-Ethinyl Estradiol Triphasic (TRI-ESTARYLLA) 0.18/0.215/0.25 MG-35 MCG tablet; Take 1 tablet by mouth daily. (Needs to be seen before next refill)  Dispense: 90 tablet; Refill: 4  5. Vasomotor headache - Norgestimate-Ethinyl Estradiol Triphasic (TRI-ESTARYLLA) 0.18/0.215/0.25 MG-35 MCG tablet; Take 1 tablet by mouth daily. (Needs to be seen before next refill)  Dispense: 90 tablet; Refill: 4  6. Birth control counseling - Norgestimate-Ethinyl Estradiol Triphasic (TRI-ESTARYLLA) 0.18/0.215/0.25 MG-35 MCG tablet; Take 1 tablet by mouth daily. (Needs to be seen before next refill)  Dispense: 90 tablet; Refill: 4     I discussed the assessment and treatment plan with the patient. The patient was provided an opportunity to ask questions and all were answered. The patient agreed with the plan and demonstrated an understanding of the instructions.   The patient was advised to call back or seek an in-person evaluation if the symptoms worsen or if the condition fails to improve as anticipated.  The above assessment and management plan was discussed with the patient. The patient verbalized understanding of and has agreed to the management plan. Patient is aware to call the clinic if symptoms persist or worsen. Patient is aware when to return to the clinic for a follow-up visit. Patient educated on when it is appropriate to go to the emergency department.   Time call ended:  11:56 AM   I provided 11 minutes of  non  face-to-face time during this encounter.    Evelina Dun, FNP

## 2020-12-13 NOTE — Patient Instructions (Signed)
Health Maintenance, Female Adopting a healthy lifestyle and getting preventive care are important in promoting health and wellness. Ask your health care provider about: The right schedule for you to have regular tests and exams. Things you can do on your own to prevent diseases and keep yourself healthy. What should I know about diet, weight, and exercise? Eat a healthy diet  Eat a diet that includes plenty of vegetables, fruits, low-fat dairy products, and lean protein. Do not eat a lot of foods that are high in solid fats, added sugars, or sodium. Maintain a healthy weight Body mass index (BMI) is used to identify weight problems. It estimates body fat based on height and weight. Your health care provider can help determine your BMI and help you achieve or maintain a healthy weight. Get regular exercise Get regular exercise. This is one of the most important things you can do for your health. Most adults should: Exercise for at least 150 minutes each week. The exercise should increase your heart rate and make you sweat (moderate-intensity exercise). Do strengthening exercises at least twice a week. This is in addition to the moderate-intensity exercise. Spend less time sitting. Even light physical activity can be beneficial. Watch cholesterol and blood lipids Have your blood tested for lipids and cholesterol at 52 years of age, then have this test every 5 years. Have your cholesterol levels checked more often if: Your lipid or cholesterol levels are high. You are older than 52 years of age. You are at high risk for heart disease. What should I know about cancer screening? Depending on your health history and family history, you may need to have cancer screening at various ages. This may include screening for: Breast cancer. Cervical cancer. Colorectal cancer. Skin cancer. Lung cancer. What should I know about heart disease, diabetes, and high blood pressure? Blood pressure and heart  disease High blood pressure causes heart disease and increases the risk of stroke. This is more likely to develop in people who have high blood pressure readings, are of African descent, or are overweight. Have your blood pressure checked: Every 3-5 years if you are 18-39 years of age. Every year if you are 40 years old or older. Diabetes Have regular diabetes screenings. This checks your fasting blood sugar level. Have the screening done: Once every three years after age 40 if you are at a normal weight and have a low risk for diabetes. More often and at a younger age if you are overweight or have a high risk for diabetes. What should I know about preventing infection? Hepatitis B If you have a higher risk for hepatitis B, you should be screened for this virus. Talk with your health care provider to find out if you are at risk for hepatitis B infection. Hepatitis C Testing is recommended for: Everyone born from 1945 through 1965. Anyone with known risk factors for hepatitis C. Sexually transmitted infections (STIs) Get screened for STIs, including gonorrhea and chlamydia, if: You are sexually active and are younger than 52 years of age. You are older than 52 years of age and your health care provider tells you that you are at risk for this type of infection. Your sexual activity has changed since you were last screened, and you are at increased risk for chlamydia or gonorrhea. Ask your health care provider if you are at risk. Ask your health care provider about whether you are at high risk for HIV. Your health care provider may recommend a prescription medicine   to help prevent HIV infection. If you choose to take medicine to prevent HIV, you should first get tested for HIV. You should then be tested every 3 months for as long as you are taking the medicine. Pregnancy If you are about to stop having your period (premenopausal) and you may become pregnant, seek counseling before you get  pregnant. Take 400 to 800 micrograms (mcg) of folic acid every day if you become pregnant. Ask for birth control (contraception) if you want to prevent pregnancy. Osteoporosis and menopause Osteoporosis is a disease in which the bones lose minerals and strength with aging. This can result in bone fractures. If you are 65 years old or older, or if you are at risk for osteoporosis and fractures, ask your health care provider if you should: Be screened for bone loss. Take a calcium or vitamin D supplement to lower your risk of fractures. Be given hormone replacement therapy (HRT) to treat symptoms of menopause. Follow these instructions at home: Lifestyle Do not use any products that contain nicotine or tobacco, such as cigarettes, e-cigarettes, and chewing tobacco. If you need help quitting, ask your health care provider. Do not use street drugs. Do not share needles. Ask your health care provider for help if you need support or information about quitting drugs. Alcohol use Do not drink alcohol if: Your health care provider tells you not to drink. You are pregnant, may be pregnant, or are planning to become pregnant. If you drink alcohol: Limit how much you use to 0-1 drink a day. Limit intake if you are breastfeeding. Be aware of how much alcohol is in your drink. In the [place] one drink equals one 12 oz bottle of beer (355 mL), one 5 oz glass of wine (148 mL), or one 1 oz glass of hard liquor (44 mL). General instructions Schedule regular health, dental, and eye exams. Stay current with your vaccines. Tell your health care provider if: You often feel depressed. You have ever been abused or do not feel safe at home. Summary Adopting a healthy lifestyle and getting preventive care are important in promoting health and wellness. Follow your health care provider's instructions about healthy diet, exercising, and getting tested or screened for diseases. Follow your health care provider's  instructions on monitoring your cholesterol and blood pressure. This information is not intended to replace advice given to you by your health care provider. Make sure you discuss any questions you have with your health care provider. Document Revised: 05/21/2020 Document Reviewed: 03/06/2018 Elsevier Patient Education  2022 Elsevier Inc.  

## 2020-12-22 ENCOUNTER — Ambulatory Visit: Payer: Commercial Managed Care - PPO | Admitting: Pulmonary Disease

## 2020-12-22 ENCOUNTER — Encounter: Payer: Self-pay | Admitting: Pulmonary Disease

## 2020-12-22 ENCOUNTER — Ambulatory Visit (INDEPENDENT_AMBULATORY_CARE_PROVIDER_SITE_OTHER): Payer: Commercial Managed Care - PPO

## 2020-12-22 ENCOUNTER — Other Ambulatory Visit: Payer: Self-pay

## 2020-12-22 VITALS — BP 124/70 | HR 100 | Temp 98.1°F | Ht 63.0 in | Wt 154.2 lb

## 2020-12-22 DIAGNOSIS — U099 Post covid-19 condition, unspecified: Secondary | ICD-10-CM | POA: Diagnosis not present

## 2020-12-22 DIAGNOSIS — D869 Sarcoidosis, unspecified: Secondary | ICD-10-CM

## 2020-12-22 NOTE — Patient Instructions (Signed)
I am glad you are doing well after your recent COVID-19 We will get a chest x-ray today to evaluate your lungs Follow-up pneumonia.
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
## 2020-12-23 ENCOUNTER — Other Ambulatory Visit: Payer: Self-pay | Admitting: Family

## 2020-12-23 DIAGNOSIS — F411 Generalized anxiety disorder: Secondary | ICD-10-CM

## 2020-12-29 ENCOUNTER — Other Ambulatory Visit: Payer: Self-pay | Admitting: Family

## 2020-12-29 DIAGNOSIS — F411 Generalized anxiety disorder: Secondary | ICD-10-CM

## 2021-03-01 ENCOUNTER — Other Ambulatory Visit: Payer: Self-pay | Admitting: Family

## 2021-03-01 DIAGNOSIS — Z1231 Encounter for screening mammogram for malignant neoplasm of breast: Secondary | ICD-10-CM

## 2021-03-10 ENCOUNTER — Ambulatory Visit
Admission: RE | Admit: 2021-03-10 | Discharge: 2021-03-10 | Disposition: A | Discharge status: Home / Self Care | Payer: Commercial Managed Care - PPO | Source: Ambulatory Visit | Attending: Family | Admitting: Family

## 2021-03-10 DIAGNOSIS — Z1231 Encounter for screening mammogram for malignant neoplasm of breast: Secondary | ICD-10-CM

## 2021-06-13 ENCOUNTER — Encounter: Payer: Self-pay | Admitting: Nurse Practitioner

## 2021-06-13 ENCOUNTER — Ambulatory Visit: Payer: Commercial Managed Care - PPO | Admitting: Nurse Practitioner

## 2021-06-13 VITALS — BP 139/79 | HR 90 | Temp 98.8°F | Ht 63.0 in | Wt 159.0 lb

## 2021-06-13 DIAGNOSIS — J069 Acute upper respiratory infection, unspecified: Secondary | ICD-10-CM

## 2021-06-13 MED ORDER — AZITHROMYCIN 250 MG PO TABS: ORAL_TABLET | ORAL | 0 refills | Status: AC

## 2021-06-13 MED ORDER — PREDNISONE 10 MG (21) PO TBPK: ORAL_TABLET | ORAL | 0 refills | Status: DC

## 2021-06-13 NOTE — Patient Instructions (Signed)
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
## 2021-12-11 ENCOUNTER — Other Ambulatory Visit: Payer: Self-pay | Admitting: Family

## 2021-12-11 DIAGNOSIS — F411 Generalized anxiety disorder: Secondary | ICD-10-CM

## 2022-01-05 ENCOUNTER — Other Ambulatory Visit: Payer: Self-pay | Admitting: Family

## 2022-01-05 DIAGNOSIS — F411 Generalized anxiety disorder: Secondary | ICD-10-CM

## 2022-01-10 ENCOUNTER — Other Ambulatory Visit: Payer: Self-pay | Admitting: Family

## 2022-01-19 ENCOUNTER — Ambulatory Visit: Payer: Commercial Managed Care - PPO | Admitting: Family

## 2022-01-19 ENCOUNTER — Encounter: Payer: Self-pay | Admitting: Family

## 2022-01-19 ENCOUNTER — Other Ambulatory Visit: Payer: Self-pay

## 2022-01-19 VITALS — BP 137/85 | HR 82 | Temp 97.5°F | Ht 63.0 in | Wt 159.8 lb

## 2022-01-19 DIAGNOSIS — Z0001 Encounter for general adult medical examination with abnormal findings: Secondary | ICD-10-CM | POA: Diagnosis not present

## 2022-01-19 DIAGNOSIS — N644 Mastodynia: Secondary | ICD-10-CM

## 2022-01-19 DIAGNOSIS — R519 Headache, unspecified: Secondary | ICD-10-CM

## 2022-01-19 DIAGNOSIS — Z3009 Encounter for other general counseling and advice on contraception: Secondary | ICD-10-CM | POA: Insufficient documentation

## 2022-01-19 DIAGNOSIS — F411 Generalized anxiety disorder: Secondary | ICD-10-CM

## 2022-01-19 DIAGNOSIS — G44009 Cluster headache syndrome, unspecified, not intractable: Secondary | ICD-10-CM

## 2022-01-19 DIAGNOSIS — Z0000 Encounter for general adult medical examination without abnormal findings: Secondary | ICD-10-CM

## 2022-01-19 DIAGNOSIS — E663 Overweight: Secondary | ICD-10-CM

## 2022-01-19 MED ORDER — TRIAMCINOLONE ACETONIDE 0.1 % EX CREA: 1.0000 | TOPICAL_CREAM | CUTANEOUS | 1 refills | Status: DC | PRN

## 2022-01-19 MED ORDER — ESCITALOPRAM OXALATE 10 MG PO TABS: 10.0000 mg | ORAL_TABLET | Freq: Every day | ORAL | 1 refills | Status: DC

## 2022-01-19 MED ORDER — BUSPIRONE HCL 5 MG PO TABS: 5.0000 mg | ORAL_TABLET | Freq: Three times a day (TID) | ORAL | 2 refills | Status: DC | PRN

## 2022-01-19 MED ORDER — NORGESTIM-ETH ESTRAD TRIPHASIC 0.18/0.215/0.25 MG-35 MCG PO TABS: 1.0000 | ORAL_TABLET | Freq: Every day | ORAL | 1 refills | Status: DC

## 2022-01-19 NOTE — Patient Instructions (Signed)

## 2022-01-19 NOTE — Progress Notes (Signed)
Subjective:    Patient ID: Brenda Walker, female    DOB: 16-Feb-1971, 51 y.o.   MRN: [id number]  Chief Complaint  Patient presents with   Medical Management of Chronic Issues   Breast Pain    X 1 week left breast    PT calls the office today for CPE and chronic follow up.  She is followed by Pulmonologist annually for sarcoidosis. States her breathing is stable, but having slight increase in SOB since COVID.     She continues to take OC. She is not obese and does not smoke. Continues to have a period every month with 2-3 days of light bleeding.   She reports left breast tenderness around 01/02/22 and was tender for the week. It has since resolved now.  Anxiety Presents for follow-up visit. Symptoms include depressed mood, excessive worry, irritability, nervous/anxious behavior and restlessness. Symptoms occur occasionally. The severity of symptoms is moderate. The quality of sleep is good.        Review of Systems  Constitutional:  Positive for irritability.  Psychiatric/Behavioral:  The patient is nervous/anxious.   All other systems reviewed and are negative.  Family History  Problem Relation Age of Onset   Hypertension Sister    Heart disease Brother 90       MI   Breast cancer Sister    Breast cancer Paternal Aunt    Social History   Socioeconomic History   Marital status: Married    Spouse name: Not on file   Number of children: Not on file   Years of education: Not on file   Highest education level: Not on file  Occupational History   Not on file  Tobacco Use   Smoking status: Never   Smokeless tobacco: Never  Vaping Use   Vaping Use: Never used  Substance and Sexual Activity   Alcohol use: Yes    Comment: 1 glass of wine and 2 beers on weekend   Drug use: No   Sexual activity: Yes    Birth control/protection: Pill    Comment: OTC  Other Topics Concern   Not on file  Social History Narrative   ** Merged History Encounter **       Social  Determinants of Health   Financial Resource Strain: Not on file  Food Insecurity: Not on file  Transportation Needs: Not on file  Physical Activity: Not on file  Stress: Not on file  Social Connections: Not on file        Objective:   Physical Exam Vitals reviewed.  Constitutional:      General: She is not in acute distress.    Appearance: She is well-developed.  HENT:     Head: Normocephalic and atraumatic.     Right Ear: Tympanic membrane normal.     Left Ear: Tympanic membrane normal.  Eyes:     Pupils: Pupils are equal, round, and reactive to light.  Neck:     Thyroid: No thyromegaly.  Cardiovascular:     Rate and Rhythm: Normal rate and regular rhythm.     Heart sounds: Normal heart sounds. No murmur heard. Pulmonary:     Effort: Pulmonary effort is normal. No respiratory distress.     Breath sounds: Normal breath sounds. No wheezing.  Chest:  Breasts:    Right: No swelling, bleeding, inverted nipple, mass, nipple discharge, skin change or tenderness.     Left: Tenderness present. No swelling, bleeding, inverted nipple, mass, nipple discharge or skin change.  Comments: Mild tenderness Abdominal:     General: Bowel sounds are normal. There is no distension.     Palpations: Abdomen is soft.     Tenderness: There is no abdominal tenderness.  Musculoskeletal:        General: No tenderness. Normal range of motion.     Cervical back: Normal range of motion and neck supple.  Skin:    General: Skin is warm and dry.  Neurological:     Mental Status: She is alert and oriented to person, place, and time.     Cranial Nerves: No cranial nerve deficit.     Deep Tendon Reflexes: Reflexes are normal and symmetric.  Psychiatric:        Behavior: Behavior normal.        Thought Content: Thought content normal.        Judgment: Judgment normal.          BP 137/85   Pulse 82   Temp (!) 97.5 F (36.4 C) (Temporal)   Ht 5' 3"  (1.6 m)   Wt 159 lb 12.8 oz (72.5  kg)   SpO2 100%   BMI 28.31 kg/m   Assessment & Plan:  LIOR CARTELLI comes in today with chief complaint of Medical Management of Chronic Issues and Breast Pain (X 1 week left breast )   Diagnosis and orders addressed:  1. Generalized anxiety disorder - escitalopram (LEXAPRO) 10 MG tablet; Take 1 tablet (10 mg total) by mouth daily.  Dispense: 90 tablet; Refill: 1 - CMP14+EGFR - CBC with Differential/Platelet  2. Nonintractable headache, unspecified chronicity pattern, unspecified headache type - Norgestimate-Ethinyl Estradiol Triphasic (TRI-ESTARYLLA) 0.18/0.215/0.25 MG-35 MCG tablet; Take 1 tablet by mouth daily.  Dispense: 90 tablet; Refill: 1 - CMP14+EGFR - CBC with Differential/Platelet  3. Vasomotor headache - Norgestimate-Ethinyl Estradiol Triphasic (TRI-ESTARYLLA) 0.18/0.215/0.25 MG-35 MCG tablet; Take 1 tablet by mouth daily.  Dispense: 90 tablet; Refill: 1 - CMP14+EGFR - CBC with Differential/Platelet  4. Birth control counseling - Norgestimate-Ethinyl Estradiol Triphasic (TRI-ESTARYLLA) 0.18/0.215/0.25 MG-35 MCG tablet; Take 1 tablet by mouth daily.  Dispense: 90 tablet; Refill: 1 - CMP14+EGFR - CBC with Differential/Platelet  5. Overweight (BMI 25.0-29.9) - CMP14+EGFR - CBC with Differential/Platelet  6. Pain of left breast  - MM Digital Diagnostic Bilat; Future - US BREAST COMPLETE UNI LEFT INC AXILLA - CMP14+EGFR - CBC with Differential/Platelet  7. Annual physical exam - CMP14+EGFR - CBC with Differential/Platelet - Lipid panel - TSH   Labs pending Health Maintenance reviewed Diet and exercise encouraged  Follow up plan: 1 year    Evelina Dun, FNP

## 2022-01-20 LAB — CMP14+EGFR
ALT: 12 IU/L (ref 0–32)
AST: 14 IU/L (ref 0–40)
Albumin/Globulin Ratio: 1.6 (ref 1.2–2.2)
Albumin: 4.4 g/dL (ref 3.8–4.9)
Alkaline Phosphatase: 87 IU/L (ref 44–121)
BUN/Creatinine Ratio: 15 (ref 9–23)
BUN: 15 mg/dL (ref 6–24)
Bilirubin Total: <0.2 mg/dL (ref 0.0–1.2)
CO2: 22 mmol/L (ref 20–29)
Calcium: 10 mg/dL (ref 8.7–10.2)
Chloride: 104 mmol/L (ref 96–106)
Creatinine, Ser: 1.03 mg/dL — ABNORMAL HIGH (ref 0.57–1.00)
Globulin, Total: 2.8 g/dL (ref 1.5–4.5)
Glucose: 85 mg/dL (ref 70–99)
Potassium: 4.6 mmol/L (ref 3.5–5.2)
Sodium: 139 mmol/L (ref 134–144)
Total Protein: 7.2 g/dL (ref 6.0–8.5)
eGFR: 65 mL/min/{1.73_m2} (ref >59)

## 2022-01-20 LAB — CBC WITH DIFFERENTIAL/PLATELET
Basophils Absolute: 0 10*3/uL (ref 0.0–0.2)
Basos: 1 %
EOS (ABSOLUTE): 0.1 10*3/uL (ref 0.0–0.4)
Eos: 1 %
Hematocrit: 39.5 % (ref 34.0–46.6)
Hemoglobin: 12.8 g/dL (ref 11.1–15.9)
Immature Grans (Abs): 0 10*3/uL (ref 0.0–0.1)
Immature Granulocytes: 0 %
Lymphocytes Absolute: 1.4 10*3/uL (ref 0.7–3.1)
Lymphs: 23 %
MCH: 30.3 pg (ref 26.6–33.0)
MCHC: 32.4 g/dL (ref 31.5–35.7)
MCV: 94 fL (ref 79–97)
Monocytes Absolute: 0.4 10*3/uL (ref 0.1–0.9)
Monocytes: 7 %
Neutrophils Absolute: 4.3 10*3/uL (ref 1.4–7.0)
Neutrophils: 68 %
Platelets: 339 10*3/uL (ref 150–450)
RBC: 4.22 x10E6/uL (ref 3.77–5.28)
RDW: 12.4 % (ref 11.7–15.4)
WBC: 6.3 10*3/uL (ref 3.4–10.8)

## 2022-01-20 LAB — LIPID PANEL
Chol/HDL Ratio: 2.3 ratio (ref 0.0–4.4)
Cholesterol, Total: 190 mg/dL (ref 100–199)
HDL: 82 mg/dL (ref >39)
LDL Chol Calc (NIH): 93 mg/dL (ref 0–99)
Triglycerides: 86 mg/dL (ref 0–149)
VLDL Cholesterol Cal: 15 mg/dL (ref 5–40)

## 2022-01-20 LAB — TSH: TSH: 1.44 u[IU]/mL (ref 0.450–4.500)

## 2022-02-08 ENCOUNTER — Ambulatory Visit
Admission: RE | Admit: 2022-02-08 | Discharge: 2022-02-08 | Disposition: A | Discharge status: Home / Self Care | Payer: Commercial Managed Care - PPO | Source: Ambulatory Visit | Attending: Family | Admitting: Family

## 2022-02-08 DIAGNOSIS — N644 Mastodynia: Secondary | ICD-10-CM

## 2022-02-14 ENCOUNTER — Other Ambulatory Visit: Payer: Self-pay | Admitting: Family

## 2022-05-16 ENCOUNTER — Other Ambulatory Visit: Payer: Self-pay | Admitting: Family

## 2022-05-17 ENCOUNTER — Other Ambulatory Visit: Payer: Self-pay | Admitting: Family

## 2022-06-02 ENCOUNTER — Telehealth: Payer: Commercial Managed Care - PPO | Admitting: Physician Assistant

## 2022-06-02 DIAGNOSIS — J019 Acute sinusitis, unspecified: Secondary | ICD-10-CM

## 2022-06-02 DIAGNOSIS — B9689 Other specified bacterial agents as the cause of diseases classified elsewhere: Secondary | ICD-10-CM | POA: Diagnosis not present

## 2022-06-02 MED ORDER — DOXYCYCLINE HYCLATE 100 MG PO TABS: 100.0000 mg | ORAL_TABLET | Freq: Two times a day (BID) | ORAL | 0 refills | Status: DC

## 2022-06-02 NOTE — Progress Notes (Signed)
E-Visit for Sinus Problems  We are sorry that you are not feeling well.  Here is how we plan to help!  Based on what you have shared with me it looks like you have sinusitis.  Sinusitis is inflammation and infection in the sinus cavities of the head.  Based on your presentation I believe you most likely have Acute Bacterial Sinusitis.  This is an infection caused by bacteria and is treated with antibiotics. I have prescribed Doxycycline 100mg by mouth twice a day for 10 days. You may use an oral decongestant such as Mucinex D or if you have glaucoma or high blood pressure use plain Mucinex. Saline nasal spray help and can safely be used as often as needed for congestion.  If you develop worsening sinus pain, fever or notice severe headache and vision changes, or if symptoms are not better after completion of antibiotic, please schedule an appointment with a health care provider.    Sinus infections are not as easily transmitted as other respiratory infection, however we still recommend that you avoid close contact with loved ones, especially the very young and elderly.  Remember to wash your hands thoroughly throughout the day as this is the number one way to prevent the spread of infection!  Home Care: Only take medications as instructed by your medical team. Complete the entire course of an antibiotic. Do not take these medications with alcohol. A steam or ultrasonic humidifier can help congestion.  You can place a towel over your head and breathe in the steam from hot water coming from a faucet. Avoid close contacts especially the very young and the elderly. Cover your mouth when you cough or sneeze. Always remember to wash your hands.  Get Help Right Away If: You develop worsening fever or sinus pain. You develop a severe head ache or visual changes. Your symptoms persist after you have completed your treatment plan.  Make sure you Understand these instructions. Will watch your  condition. Will get help right away if you are not doing well or get worse.  Thank you for choosing an e-visit.  Your e-visit answers were reviewed by a board certified advanced clinical practitioner to complete your personal care plan. Depending upon the condition, your plan could have included both over the counter or prescription medications.  Please review your pharmacy choice. Make sure the pharmacy is open so you can pick up prescription now. If there is a problem, you may contact your provider through MyChart messaging and have the prescription routed to another pharmacy.  Your safety is important to us. If you have drug allergies check your prescription carefully.   For the next 24 hours you can use MyChart to ask questions about today's visit, request a non-urgent call back, or ask for a work or school excuse. You will get an email in the next two days asking about your experience. I hope that your e-visit has been valuable and will speed your recovery.  I have spent 5 minutes in review of e-visit questionnaire, review and updating patient chart, medical decision making and response to patient.   Thang Flett M Armon Orvis, PA-C  

## 2022-07-05 ENCOUNTER — Telehealth: Payer: Commercial Managed Care - PPO | Admitting: Nurse Practitioner

## 2022-07-05 DIAGNOSIS — S1096XA Insect bite of unspecified part of neck, initial encounter: Secondary | ICD-10-CM

## 2022-07-05 DIAGNOSIS — W57XXXA Bitten or stung by nonvenomous insect and other nonvenomous arthropods, initial encounter: Secondary | ICD-10-CM | POA: Diagnosis not present

## 2022-07-05 MED ORDER — DOXYCYCLINE HYCLATE 100 MG PO TABS: 100.0000 mg | ORAL_TABLET | Freq: Two times a day (BID) | ORAL | 0 refills | Status: DC

## 2022-07-05 NOTE — Progress Notes (Signed)
E-Visit for Tick Bite  Thank you for describing your tick bite, Here is how we plan to help! Based on the information that you shared with me it looks like you have A tick that bite that we will treat with a short course of doxycycline.  In most cases a tick bite is painless and does not itch.  Most tick bites in which the tick is quickly removed do not require prescriptions. Ticks can transmit several diseases if they are infected and remain attacked to your skin. Therefore the length that the tick was attached and any symptoms you have experienced after the bite are import to accurately develop your custom treatment plan. In most cases a single dose of doxycycline may prevent the development of a more serious condition.  Based on your information I have Provided a home care guide for tick bites and  instructions on when to call for help. and Your symptoms indicate that you need a longer course of antibiotics and a follow up visit with a provider. I have sent doxycycline 100 mg twice a day for 14 days to the pharmacy that you selected. You will need to schedule a follow up visit with your provider. If you do not have a primary care provider you may use our telehealth physicians on the web at [email or web address]  Which ticks  are associated with illness?  The Wood Tick (dog tick) is the size of a watermelon seed and can sometimes transmit [facility] spotted fever and [place] tick fever.   The Deer Tick (black-legged tick) is between the size of a poppy seed (pin head) and an apple seed, and can sometimes transmit Lyme disease.  A brown to black tick with a white splotch on its back is likely a female Amblyomma americanum ([place] tick). This tick has been associated with Southern Tick Associated illness ( STARI)  Lyme disease has become the most common tick-borne illness in the [place]. The risk of Lyme disease following a recognized deer tick bite is estimated to be 1%.  The majority  of cases of Lyme disease start with a bull's eye rash at the site of the tick bite. The rash can occur days to weeks (typically 7-10 days) after a tick bite. Treatment with antibiotics is indicated if this rash appears. Flu-like symptoms may accompany the rash, including: fever, chills, headaches, muscle aches, and fatigue. Removing ticks promptly may prevent tick borne disease.  What can be used to prevent Tick Bites?  Insect repellant with at leas 20% DEET. Wearing long pants with sock and shoes. Avoiding tall grass and heavily wooded areas. Checking your skin after being outdoors. Shower with a washcloth after outdoor exposures.  HOME CARE ADVICE FOR TICK BITE  Wood Tick Removal:  Use a pair of tweezers and grasp the wood tick close to the skin (on its head). Pull the wood tick straight upward without twisting or crushing it. Maintain a steady pressure until it releases its grip.   If tweezers aren't available, use fingers, a loop of thread around the jaws, or a needle between the jaws for traction.  Note: covering the tick with petroleum jelly, nail polish or rubbing alcohol doesn't work. Neither does touching the tick with a hot or cold object. Tiny Deer Tick Removal:   Needs to be scraped off with a knife blade or credit card edge. Place tick in a sealed container (e.g. glass jar, zip lock plastic bag), in case your doctor wants to see it.  Tick's Head Removal:  If the wood tick's head breaks off in the skin, it must be removed. Clean the skin. Then use a sterile needle to uncover the head and lift it out or scrape it off.  If a very small piece of the head remains, the skin will eventually slough it off. Antibiotic Ointment:  Wash the wound and your hands with soap and water after removal to prevent catching any tick disease.  Apply an over the counter antibiotic ointment (e.g. bacitracin) to the bite once. Expected Course: Tick bites normally don't itch or hurt. That's why they often  go unnoticed. Call Your Doctor If:  You can't remove the tick or the tick's head Fever, a severe head ache, or rash occur in the next 2 weeks Bite begins to look infected Lyme's disease is common in your area You have not had a tetanus in the last 10 years Your current symptoms become worse    MAKE SURE YOU  Understand these instructions. Will watch your condition. Will get help right away if you are not doing well or get worse.    Thank you for choosing an e-visit.  Your e-visit answers were reviewed by a board certified advanced clinical practitioner to complete your personal care plan. Depending upon the condition, your plan could have included both over the counter or prescription medications.  Please review your pharmacy choice. Make sure the pharmacy is open so you can pick up prescription now. If there is a problem, you may contact your provider through [employer] and have the prescription routed to another pharmacy.  Your safety is important to [place]. If you have drug allergies check your prescription carefully.   For the next 24 hours you can use MyChart to ask questions about today's visit, request a non-urgent call back, or ask for a work or school excuse. You will get an email in the next two days asking about your experience. I hope that your e-visit has been valuable and will speed your recovery.  Mary-Margaret Daphine Deutscher, FNP   5-10 minutes spent reviewing and documenting in chart.

## 2022-07-08 ENCOUNTER — Other Ambulatory Visit: Payer: Self-pay | Admitting: Family

## 2022-07-08 DIAGNOSIS — Z3009 Encounter for other general counseling and advice on contraception: Secondary | ICD-10-CM

## 2022-07-08 DIAGNOSIS — R519 Headache, unspecified: Secondary | ICD-10-CM

## 2022-07-08 DIAGNOSIS — G44009 Cluster headache syndrome, unspecified, not intractable: Secondary | ICD-10-CM

## 2022-07-21 ENCOUNTER — Ambulatory Visit: Payer: Commercial Managed Care - PPO | Admitting: Family

## 2022-07-21 ENCOUNTER — Encounter: Payer: Self-pay | Admitting: Family

## 2022-07-21 VITALS — BP 134/78 | HR 92 | Temp 97.3°F | Ht 63.0 in | Wt 162.0 lb

## 2022-07-21 DIAGNOSIS — E663 Overweight: Secondary | ICD-10-CM | POA: Diagnosis not present

## 2022-07-21 DIAGNOSIS — F411 Generalized anxiety disorder: Secondary | ICD-10-CM

## 2022-07-21 DIAGNOSIS — G44009 Cluster headache syndrome, unspecified, not intractable: Secondary | ICD-10-CM

## 2022-07-21 DIAGNOSIS — D869 Sarcoidosis, unspecified: Secondary | ICD-10-CM

## 2022-07-21 DIAGNOSIS — Z3009 Encounter for other general counseling and advice on contraception: Secondary | ICD-10-CM

## 2022-07-21 DIAGNOSIS — R519 Headache, unspecified: Secondary | ICD-10-CM

## 2022-07-21 LAB — CMP14+EGFR
ALT: 20 IU/L (ref 0–32)
AST: 18 IU/L (ref 0–40)
Albumin/Globulin Ratio: 1.5 (ref 1.2–2.2)
Albumin: 4 g/dL (ref 3.8–4.9)
Alkaline Phosphatase: 86 IU/L (ref 44–121)
BUN/Creatinine Ratio: 13 (ref 9–23)
BUN: 13 mg/dL (ref 6–24)
Bilirubin Total: 0.3 mg/dL (ref 0.0–1.2)
CO2: 21 mmol/L (ref 20–29)
Calcium: 9.5 mg/dL (ref 8.7–10.2)
Chloride: 102 mmol/L (ref 96–106)
Creatinine, Ser: 0.97 mg/dL (ref 0.57–1.00)
Globulin, Total: 2.6 g/dL (ref 1.5–4.5)
Glucose: 89 mg/dL (ref 70–99)
Potassium: 4.5 mmol/L (ref 3.5–5.2)
Sodium: 138 mmol/L (ref 134–144)
Total Protein: 6.6 g/dL (ref 6.0–8.5)
eGFR: 69 mL/min/{1.73_m2} (ref >59)

## 2022-07-21 LAB — CBC WITH DIFFERENTIAL/PLATELET
Basophils Absolute: 0 10*3/uL (ref 0.0–0.2)
Basos: 1 %
EOS (ABSOLUTE): 0.1 10*3/uL (ref 0.0–0.4)
Eos: 2 %
Hematocrit: 36.9 % (ref 34.0–46.6)
Hemoglobin: 12.2 g/dL (ref 11.1–15.9)
Immature Grans (Abs): 0 10*3/uL (ref 0.0–0.1)
Immature Granulocytes: 0 %
Lymphocytes Absolute: 0.8 10*3/uL (ref 0.7–3.1)
Lymphs: 20 %
MCH: 30.5 pg (ref 26.6–33.0)
MCHC: 33.1 g/dL (ref 31.5–35.7)
MCV: 92 fL (ref 79–97)
Monocytes Absolute: 0.4 10*3/uL (ref 0.1–0.9)
Monocytes: 9 %
Neutrophils Absolute: 2.8 10*3/uL (ref 1.4–7.0)
Neutrophils: 68 %
Platelets: 307 10*3/uL (ref 150–450)
RBC: 4 x10E6/uL (ref 3.77–5.28)
RDW: 13.1 % (ref 11.7–15.4)
WBC: 4.1 10*3/uL (ref 3.4–10.8)

## 2022-07-21 MED ORDER — TRIAMCINOLONE ACETONIDE 0.1 % EX CREA: TOPICAL_CREAM | CUTANEOUS | 2 refills | Status: DC

## 2022-07-21 MED ORDER — ESCITALOPRAM OXALATE 10 MG PO TABS: 10.0000 mg | ORAL_TABLET | Freq: Every day | ORAL | 2 refills | Status: DC

## 2022-07-21 MED ORDER — NORGESTIM-ETH ESTRAD TRIPHASIC 0.18/0.215/0.25 MG-35 MCG PO TABS: 1.0000 | ORAL_TABLET | Freq: Every day | ORAL | 2 refills | Status: DC

## 2022-07-21 MED ORDER — BUSPIRONE HCL 5 MG PO TABS: 5.0000 mg | ORAL_TABLET | Freq: Three times a day (TID) | ORAL | 0 refills | Status: DC | PRN

## 2022-07-21 NOTE — Patient Instructions (Signed)

## 2022-07-21 NOTE — Progress Notes (Signed)
Subjective:    Patient ID: Brenda Walker, female    DOB: [birth year], 52 y.o.   MRN: [id number]  Chief Complaint  Patient presents with   Medical Management of Chronic Issues   PT calls the office today for chronic follow up.  She is followed by Pulmonologist annually for sarcoidosis. States her breathing is stable.   She continues to take OC. She is not obese and does not smoke. Continues to have a period every month with 2-3 days of light bleeding.  Depression        This is a chronic problem.  The current episode started more than 1 year ago.   The problem occurs intermittently.  Associated symptoms include restlessness.  Associated symptoms include no helplessness, no hopelessness and not sad.  Past medical history includes anxiety.   Anxiety Presents for follow-up visit. Symptoms include excessive worry, irritability and restlessness. Patient reports no palpitations. Symptoms occur occasionally.        Review of Systems  Constitutional:  Positive for irritability.  Cardiovascular:  Negative for palpitations.  Psychiatric/Behavioral:  Positive for depression.   All other systems reviewed and are negative.      Objective:   Physical Exam Vitals reviewed.  Constitutional:      General: She is not in acute distress.    Appearance: She is well-developed.  HENT:     Head: Normocephalic and atraumatic.     Right Ear: Tympanic membrane normal.     Left Ear: Tympanic membrane normal.  Eyes:     Pupils: Pupils are equal, round, and reactive to light.  Neck:     Thyroid: No thyromegaly.  Cardiovascular:     Rate and Rhythm: Normal rate and regular rhythm.     Heart sounds: Normal heart sounds. No murmur heard. Pulmonary:     Effort: Pulmonary effort is normal. No respiratory distress.     Breath sounds: Normal breath sounds. No wheezing.  Abdominal:     General: Bowel sounds are normal. There is no distension.     Palpations: Abdomen is soft.     Tenderness: There  is no abdominal tenderness.  Musculoskeletal:        General: No tenderness. Normal range of motion.     Cervical back: Normal range of motion and neck supple.  Skin:    General: Skin is warm and dry.  Neurological:     Mental Status: She is alert and oriented to person, place, and time.     Cranial Nerves: No cranial nerve deficit.     Deep Tendon Reflexes: Reflexes are normal and symmetric.  Psychiatric:        Behavior: Behavior normal.        Thought Content: Thought content normal.        Judgment: Judgment normal.          BP 134/78   Pulse 92   Temp (!) 97.3 F (36.3 C) (Temporal)   Ht 5\' 3"  (1.6 m)   Wt 162 lb (73.5 kg)   SpO2 99%   BMI 28.70 kg/m   Assessment & Plan:  Brenda Walker comes in today with chief complaint of Medical Management of Chronic Issues   Diagnosis and orders addressed:  1. Generalized anxiety disorder - busPIRone (BUSPAR) 5 MG tablet; Take 1 tablet (5 mg total) by mouth 3 (three) times daily as needed.  Dispense: 270 tablet; Refill: 0 - escitalopram (LEXAPRO) 10 MG tablet; Take 1 tablet (10 mg total) by  mouth daily.  Dispense: 90 tablet; Refill: 2 - CMP14+EGFR - CBC with Differential/Platelet  2. Vasomotor headache - Norgestimate-Ethinyl Estradiol Triphasic (TRI-ESTARYLLA) 0.18/0.215/0.25 MG-35 MCG tablet; Take 1 tablet by mouth daily.  Dispense: 100 tablet; Refill: 2 - CMP14+EGFR - CBC with Differential/Platelet  3. Birth control counseling - Norgestimate-Ethinyl Estradiol Triphasic (TRI-ESTARYLLA) 0.18/0.215/0.25 MG-35 MCG tablet; Take 1 tablet by mouth daily.  Dispense: 100 tablet; Refill: 2 - CMP14+EGFR - CBC with Differential/Platelet  4. Nonintractable headache, unspecified chronicity pattern, unspecified headache type - Norgestimate-Ethinyl Estradiol Triphasic (TRI-ESTARYLLA) 0.18/0.215/0.25 MG-35 MCG tablet; Take 1 tablet by mouth daily.  Dispense: 100 tablet; Refill: 2 - CMP14+EGFR - CBC with  Differential/Platelet  5. Overweight (BMI 25.0-29.9) - CMP14+EGFR - CBC with Differential/Platelet  6. Sarcoidosis - CMP14+EGFR - CBC with Differential/Platelet   Labs pending Health Maintenance reviewed Diet and exercise encouraged  Follow up plan: 1 year   Jannifer Rodney, FNP

## 2023-02-05 ENCOUNTER — Other Ambulatory Visit: Payer: Self-pay | Admitting: Family

## 2023-02-05 DIAGNOSIS — Z1231 Encounter for screening mammogram for malignant neoplasm of breast: Secondary | ICD-10-CM

## 2023-03-02 ENCOUNTER — Ambulatory Visit: Payer: Commercial Managed Care - PPO

## 2023-03-08 ENCOUNTER — Telehealth: Payer: Commercial Managed Care - PPO | Admitting: Physician Assistant

## 2023-03-08 DIAGNOSIS — K649 Unspecified hemorrhoids: Secondary | ICD-10-CM | POA: Diagnosis not present

## 2023-03-08 MED ORDER — HYDROCORTISONE ACETATE 25 MG RE SUPP: 25.0000 mg | Freq: Two times a day (BID) | RECTAL | 0 refills | Status: DC

## 2023-03-08 NOTE — Progress Notes (Signed)

## 2023-03-08 NOTE — Progress Notes (Signed)
I have spent 5 minutes in review of e-visit questionnaire, review and updating patient chart, medical decision making and response to patient.   Mia Milan Cody Jacklynn Dehaas, PA-C    

## 2023-03-22 ENCOUNTER — Ambulatory Visit: Payer: Self-pay | Admitting: Family

## 2023-03-22 NOTE — Telephone Encounter (Signed)
  Chief Complaint: "chest fluttering"  Symptoms: palpitations, L sided chest pain, lightheaded, anxious  Frequency: intermittent x 1 week Pertinent Negatives: Patient denies sweating, syncope, SOB or difficulty breathing  Disposition: [x] ED /[] Urgent Care (no appt availability in office) / [] Appointment(In office/virtual)/ []  [facility]/ [] Home Care/ [x] Refused Recommended Disposition /[] [facility]/ []  Follow-up with PCP Additional Notes: Pt c/o intermittent palpitations that last up to a minute every other day. Pt states she feels her heart flutter and beat fast. Pt reports increased use of her Buspar,  caffeine intake and anxiety. Pt states she also feels lightheaded sometimes and gets a left sided chest pain intermittent. Recommended ED, pt refused and requesting OV with her PCP.   Copied from CRM [id number]. Topic: Clinical - Pink Word Triage >> Mar 22, 2023  1:24 PM Eunice Blase wrote: Reason for Triage: Pt called stated that stated last week and occurring everyday. Transferred to triage. Reason for Disposition  Dizziness, lightheadedness, or weakness  Answer Assessment - Initial Assessment Questions 1. DESCRIPTION: "Please describe your heart rate or heartbeat that you are having" (e.g., fast/slow, regular/irregular, skipped or extra beats, "palpitations")     Fluttering, fast HR. 2. ONSET: "When did it start?" (Minutes, hours or days)      X 1 week; every other day  3. DURATION: "How long does it last" (e.g., seconds, minutes, hours)     Pt states it lasts 30 seconds to a minute.  4. PATTERN "Does it come and go, or has it been constant since it started?"  "Does it get worse with exertion?"   "Are you feeling it now?"     Comes and goes, states last night she felt it after she was eating. Doesn't correlate to exerting.  5. TAP: "Using your hand, can you tap out what you are feeling on a chair or table in front of you, so that I can hear?" (Note: not all  patients can do this)       Pt states it is fast beats together.  6. HEART RATE: "Can you tell me your heart rate?" "How many beats in 15 seconds?"  (Note: not all patients can do this)       Pt states she took her BP and it was 142/79. Pt states she has not checked her HR.  7. RECURRENT SYMPTOM: "Have you ever had this before?" If Yes, ask: "When was the last time?" and "What happened that time?"      Denies past occurrence.  8. CAUSE: "What do you think is causing the palpitations?"     Pt states she thinks it might be related to her anxiety, increased use of Buspar and increased caffeine use.  9. CARDIAC HISTORY: "Do you have any history of heart disease?" (e.g., heart attack, angina, bypass surgery, angioplasty, arrhythmia)      Denies personal hx.  10. OTHER SYMPTOMS: "Do you have any other symptoms?" (e.g., dizziness, chest pain, sweating, difficulty breathing)       Anxiety, lightheaded (felt one day last week).  Protocols used: Heart Rate and Heartbeat Questions-A-AH

## 2023-03-23 ENCOUNTER — Ambulatory Visit: Payer: Commercial Managed Care - PPO | Admitting: Family Medicine

## 2023-03-23 ENCOUNTER — Other Ambulatory Visit: Payer: Commercial Managed Care - PPO

## 2023-03-23 VITALS — BP 145/85 | HR 105 | Temp 98.5°F | Ht 63.0 in | Wt 160.0 lb

## 2023-03-23 DIAGNOSIS — R002 Palpitations: Secondary | ICD-10-CM | POA: Diagnosis not present
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
## 2023-03-23 NOTE — Patient Instructions (Signed)

## 2023-03-24 LAB — ANEMIA PROFILE B
Basophils Absolute: 0.1 10*3/uL (ref 0.0–0.2)
Basos: 1 %
EOS (ABSOLUTE): 0.1 10*3/uL (ref 0.0–0.4)
Eos: 1 %
Ferritin: 46 ng/mL (ref 15–150)
Folate: 7.9 ng/mL (ref >3.0)
Hematocrit: 40.1 % (ref 34.0–46.6)
Hemoglobin: 13.3 g/dL (ref 11.1–15.9)
Immature Grans (Abs): 0 10*3/uL (ref 0.0–0.1)
Immature Granulocytes: 0 %
Iron Saturation: 23 % (ref 15–55)
Iron: 88 ug/dL (ref 27–159)
Lymphocytes Absolute: 1.1 10*3/uL (ref 0.7–3.1)
Lymphs: 20 %
MCH: 31.1 pg (ref 26.6–33.0)
MCHC: 33.2 g/dL (ref 31.5–35.7)
MCV: 94 fL (ref 79–97)
Monocytes Absolute: 0.5 10*3/uL (ref 0.1–0.9)
Monocytes: 9 %
Neutrophils Absolute: 3.9 10*3/uL (ref 1.4–7.0)
Neutrophils: 69 %
Platelets: 363 10*3/uL (ref 150–450)
RBC: 4.27 x10E6/uL (ref 3.77–5.28)
RDW: 12.7 % (ref 11.7–15.4)
Retic Ct Pct: 1.5 % (ref 0.6–2.6)
Total Iron Binding Capacity: 381 ug/dL (ref 250–450)
UIBC: 293 ug/dL (ref 131–425)
Vitamin B-12: 434 pg/mL (ref 232–1245)
WBC: 5.7 10*3/uL (ref 3.4–10.8)

## 2023-03-24 LAB — CMP14+EGFR
ALT: 16 IU/L (ref 0–32)
AST: 18 IU/L (ref 0–40)
Albumin: 4.3 g/dL (ref 3.8–4.9)
Alkaline Phosphatase: 85 IU/L (ref 44–121)
BUN/Creatinine Ratio: 13 (ref 9–23)
BUN: 12 mg/dL (ref 6–24)
CO2: 24 mmol/L (ref 20–29)
Calcium: 9.9 mg/dL (ref 8.7–10.2)
Chloride: 103 mmol/L (ref 96–106)
Creatinine, Ser: 0.92 mg/dL (ref 0.57–1.00)
Globulin, Total: 3 g/dL (ref 1.5–4.5)
Glucose: 80 mg/dL (ref 70–99)
Potassium: 4.8 mmol/L (ref 3.5–5.2)
Sodium: 139 mmol/L (ref 134–144)
Total Protein: 7.3 g/dL (ref 6.0–8.5)
eGFR: 74 mL/min/{1.73_m2} (ref >59)

## 2023-03-24 LAB — MAGNESIUM: Magnesium: 2.1 mg/dL (ref 1.6–2.3)

## 2023-03-24 LAB — VITAMIN D 25 HYDROXY (VIT D DEFICIENCY, FRACTURES): Vit D, 25-Hydroxy: 35.1 ng/mL (ref 30.0–100.0)

## 2023-03-24 LAB — TSH: TSH: 1.26 u[IU]/mL (ref 0.450–4.500)

## 2023-04-03 ENCOUNTER — Ambulatory Visit: Payer: Commercial Managed Care - PPO

## 2023-04-06 ENCOUNTER — Encounter: Payer: Self-pay | Admitting: Family Medicine

## 2023-04-10 ENCOUNTER — Ambulatory Visit
Admission: RE | Admit: 2023-04-10 | Discharge: 2023-04-10 | Disposition: A | Discharge status: Home / Self Care | Payer: Commercial Managed Care - PPO | Source: Ambulatory Visit | Attending: Family | Admitting: Family

## 2023-04-10 DIAGNOSIS — Z1231 Encounter for screening mammogram for malignant neoplasm of breast: Secondary | ICD-10-CM

## 2023-04-18 ENCOUNTER — Encounter: Payer: Self-pay | Admitting: Family Medicine

## 2023-04-18 NOTE — Progress Notes (Signed)
Frequent PVCs and short lasting runs of ectopy. Recommend avoiding caffeine and stimulants. If symptoms continue, can follow up with Cardiology.

## 2023-05-04 ENCOUNTER — Telehealth: Payer: Self-pay

## 2023-05-04 NOTE — Telephone Encounter (Signed)
 Copied from CRM [id number]. Topic: Clinical - Medication Question >> May 04, 2023  3:07 PM Carlatta H wrote: Reason for CRM: Patients son has the flu and she would like a prescription called in to prevent her from getting the flu//She would also like a call letting her know a prescription was called in//

## 2023-05-05 ENCOUNTER — Other Ambulatory Visit: Payer: Self-pay | Admitting: Family

## 2023-05-05 DIAGNOSIS — F411 Generalized anxiety disorder: Secondary | ICD-10-CM

## 2023-05-07 NOTE — Telephone Encounter (Signed)
 Patient aware she has already been exposed if she gets symptoms to call us  back

## 2023-05-29 ENCOUNTER — Encounter: Payer: Self-pay | Admitting: Family

## 2023-05-29 ENCOUNTER — Ambulatory Visit: Payer: Commercial Managed Care - PPO | Admitting: Family

## 2023-05-29 VITALS — BP 131/84 | HR 81 | Temp 97.4°F | Ht 63.0 in | Wt 162.2 lb

## 2023-05-29 DIAGNOSIS — Z713 Dietary counseling and surveillance: Secondary | ICD-10-CM

## 2023-05-29 DIAGNOSIS — G44009 Cluster headache syndrome, unspecified, not intractable: Secondary | ICD-10-CM

## 2023-05-29 DIAGNOSIS — Z0001 Encounter for general adult medical examination with abnormal findings: Secondary | ICD-10-CM | POA: Diagnosis not present

## 2023-05-29 DIAGNOSIS — F411 Generalized anxiety disorder: Secondary | ICD-10-CM | POA: Diagnosis not present

## 2023-05-29 DIAGNOSIS — Z3009 Encounter for other general counseling and advice on contraception: Secondary | ICD-10-CM

## 2023-05-29 DIAGNOSIS — D869 Sarcoidosis, unspecified: Secondary | ICD-10-CM

## 2023-05-29 DIAGNOSIS — Z0000 Encounter for general adult medical examination without abnormal findings: Secondary | ICD-10-CM

## 2023-05-29 DIAGNOSIS — E663 Overweight: Secondary | ICD-10-CM

## 2023-05-29 DIAGNOSIS — Z6828 Body mass index (BMI) 28.0-28.9, adult: Secondary | ICD-10-CM

## 2023-05-29 DIAGNOSIS — R519 Headache, unspecified: Secondary | ICD-10-CM

## 2023-05-29 MED ORDER — NORGESTIM-ETH ESTRAD TRIPHASIC 0.18/0.215/0.25 MG-35 MCG PO TABS: 1.0000 | ORAL_TABLET | Freq: Every day | ORAL | 2 refills | Status: DC

## 2023-05-29 MED ORDER — BUSPIRONE HCL 5 MG PO TABS: 5.0000 mg | ORAL_TABLET | Freq: Three times a day (TID) | ORAL | 0 refills | Status: DC | PRN

## 2023-05-29 MED ORDER — PHENTERMINE HCL 37.5 MG PO TABS: 37.5000 mg | ORAL_TABLET | Freq: Every day | ORAL | 2 refills | Status: DC

## 2023-05-29 MED ORDER — ESCITALOPRAM OXALATE 10 MG PO TABS: 10.0000 mg | ORAL_TABLET | Freq: Every day | ORAL | 3 refills | Status: DC

## 2023-05-29 NOTE — Progress Notes (Signed)
 Subjective:    Patient ID: Brenda Walker, female    DOB: 1970/12/18, 53 y.o.   MRN: [id number]  Chief Complaint  Patient presents with   Medical Management of Chronic Issues    DISCUSS WT LOSS WANTS TO TRY MEDS    PT calls the office today for CPE and chronic follow up.  She is followed by Pulmonologist annually for sarcoidosis. States her breathing is stable.   She continues to take OC. She is not obese and does not smoke. Continues to have a period every month with 2-3 days of light bleeding.  Depression        This is a chronic problem.  The current episode started more than 1 year ago.   The problem occurs intermittently.  Associated symptoms include restlessness.  Associated symptoms include no helplessness, no hopelessness and not sad.  Past treatments include SSRIs - Selective serotonin reuptake inhibitors.  Past medical history includes anxiety.   Anxiety Presents for follow-up visit. Symptoms include excessive worry, irritability and restlessness. Patient reports no palpitations. Symptoms occur occasionally. The severity of symptoms is mild.        Review of Systems  Constitutional:  Positive for irritability.  Cardiovascular:  Negative for palpitations.  All other systems reviewed and are negative.  Family History  Problem Relation Age of Onset   Dementia Mother    Cancer Sister    Hypertension Sister    Breast cancer Sister    Heart disease Brother 2       MI   Breast cancer Paternal Aunt    Social History   Socioeconomic History   Marital status: Married    Spouse name: Not on file   Number of children: Not on file   Years of education: Not on file   Highest education level: Bachelor's degree (e.g., BA, AB, BS)  Occupational History   Not on file  Tobacco Use   Smoking status: Never   Smokeless tobacco: Never  Vaping Use   Vaping status: Never Used  Substance and Sexual Activity   Alcohol use: Yes    Comment: 1 glass of wine and 2 beers on  weekend   Drug use: No   Sexual activity: Yes    Birth control/protection: Pill    Comment: OTC  Other Topics Concern   Not on file  Social History Narrative   ** Merged History Encounter **       Social Drivers of Health   Financial Resource Strain: Low Risk  (03/23/2023)   Overall Financial Resource Strain (CARDIA)    Difficulty of Paying Living Expenses: Not hard at all  Food Insecurity: No Food Insecurity (03/23/2023)   Hunger Vital Sign    Worried About Running Out of Food in the Last Year: Never true    Ran Out of Food in the Last Year: Never true  Transportation Needs: No Transportation Needs (03/23/2023)   PRAPARE - Administrator, Civil Service (Medical): No    Lack of Transportation (Non-Medical): No  Physical Activity: Insufficiently Active (03/23/2023)   Exercise Vital Sign    Days of Exercise per Week: 2 days    Minutes of Exercise per Session: 30 min  Stress: Stress Concern Present (03/23/2023)   [employer] of Occupational Health - Occupational Stress Questionnaire    Feeling of Stress : To some extent  Social Connections: Moderately Isolated (03/23/2023)   Social Connection and Isolation Panel [NHANES]    Frequency of Communication with Friends  and Family: More than three times a week    Frequency of Social Gatherings with Friends and Family: More than three times a week    Attends Religious Services: Never    Database administrator or Organizations: No    Attends Engineer, structural: Not on file    Marital Status: Married       Objective:   Physical Exam Vitals reviewed.  Constitutional:      General: She is not in acute distress.    Appearance: She is well-developed.  HENT:     Head: Normocephalic and atraumatic.     Right Ear: Tympanic membrane normal.     Left Ear: Tympanic membrane normal.  Eyes:     Pupils: Pupils are equal, round, and reactive to light.  Neck:     Thyroid: No thyromegaly.  Cardiovascular:      Rate and Rhythm: Normal rate and regular rhythm.     Heart sounds: Normal heart sounds. No murmur heard. Pulmonary:     Effort: Pulmonary effort is normal. No respiratory distress.     Breath sounds: Normal breath sounds. No wheezing.  Abdominal:     General: Bowel sounds are normal. There is no distension.     Palpations: Abdomen is soft.     Tenderness: There is no abdominal tenderness.  Musculoskeletal:        General: No tenderness. Normal range of motion.     Cervical back: Normal range of motion and neck supple.  Skin:    General: Skin is warm and dry.  Neurological:     Mental Status: She is alert and oriented to person, place, and time.     Cranial Nerves: No cranial nerve deficit.     Deep Tendon Reflexes: Reflexes are normal and symmetric.  Psychiatric:        Behavior: Behavior normal.        Thought Content: Thought content normal.        Judgment: Judgment normal.          BP 131/84   Pulse 81   Temp (!) 97.4 F (36.3 C) (Temporal)   Ht 5\' 3"  (1.6 m)   Wt 162 lb 3.2 oz (73.6 kg)   LMP  (Approximate)   SpO2 98%   BMI 28.73 kg/m   Assessment & Plan:  Brenda Walker comes in today with chief complaint of Medical Management of Chronic Issues (DISCUSS WT LOSS WANTS TO TRY MEDS )   Diagnosis and orders addressed:  1. Generalized anxiety disorder - busPIRone (BUSPAR) 5 MG tablet; Take 1 tablet (5 mg total) by mouth 3 (three) times daily as needed.  Dispense: 270 tablet; Refill: 0 - escitalopram (LEXAPRO) 10 MG tablet; Take 1 tablet (10 mg total) by mouth daily. **NEEDS TO BE SEEN BEFORE NEXT REFILL**  Dispense: 90 tablet; Refill: 3  2. Vasomotor headache - Norgestimate-Ethinyl Estradiol Triphasic (TRI-ESTARYLLA) 0.18/0.215/0.25 MG-35 MCG tablet; Take 1 tablet by mouth daily.  Dispense: 100 tablet; Refill: 2  3. Birth control counseling - Norgestimate-Ethinyl Estradiol Triphasic (TRI-ESTARYLLA) 0.18/0.215/0.25 MG-35 MCG tablet; Take 1 tablet by mouth  daily.  Dispense: 100 tablet; Refill: 2  4. Nonintractable headache, unspecified chronicity pattern, unspecified headache type - Norgestimate-Ethinyl Estradiol Triphasic (TRI-ESTARYLLA) 0.18/0.215/0.25 MG-35 MCG tablet; Take 1 tablet by mouth daily.  Dispense: 100 tablet; Refill: 2  5. Annual physical exam (Primary) - Lipid panel  6. Sarcoidosis  7. Overweight (BMI 25.0-29.9) - phentermine (ADIPEX-P) 37.5 MG tablet; Take 1 tablet (  37.5 mg total) by mouth daily before breakfast.  Dispense: 30 tablet; Refill: 2  8. Weight loss counseling, encounter for - phentermine (ADIPEX-P) 37.5 MG tablet; Take 1 tablet (37.5 mg total) by mouth daily before breakfast.  Dispense: 30 tablet; Refill: 2   Labs pending Will stop OC at this time.  Will given phentermine, discussed may make palpitations worse. If so stop.  Encourage healthy diet and exercise  Health Maintenance reviewed Diet and exercise encouraged  Follow up plan: 3 months   Jannifer Rodney, FNP

## 2023-05-29 NOTE — Patient Instructions (Signed)

## 2023-05-30 LAB — LIPID PANEL
Chol/HDL Ratio: 3.4 ratio (ref 0.0–4.4)
Cholesterol, Total: 201 mg/dL — ABNORMAL HIGH (ref 100–199)
HDL: 60 mg/dL (ref >39)
LDL Chol Calc (NIH): 109 mg/dL — ABNORMAL HIGH (ref 0–99)
Triglycerides: 186 mg/dL — ABNORMAL HIGH (ref 0–149)
VLDL Cholesterol Cal: 32 mg/dL (ref 5–40)

## 2023-06-16 ENCOUNTER — Other Ambulatory Visit: Payer: Self-pay | Admitting: Family

## 2023-08-30 ENCOUNTER — Other Ambulatory Visit: Payer: Self-pay | Admitting: Family

## 2023-08-30 ENCOUNTER — Other Ambulatory Visit: Payer: Self-pay | Admitting: Medical Genetics

## 2023-08-30 DIAGNOSIS — F411 Generalized anxiety disorder: Secondary | ICD-10-CM

## 2023-09-04 ENCOUNTER — Ambulatory Visit: Admitting: Family

## 2023-09-04 ENCOUNTER — Encounter: Payer: Self-pay | Admitting: Family

## 2023-09-04 VITALS — BP 126/85 | HR 87 | Temp 97.6°F | Ht 63.0 in | Wt 149.0 lb

## 2023-09-04 DIAGNOSIS — F411 Generalized anxiety disorder: Secondary | ICD-10-CM

## 2023-09-04 DIAGNOSIS — Z713 Dietary counseling and surveillance: Secondary | ICD-10-CM

## 2023-09-04 DIAGNOSIS — E663 Overweight: Secondary | ICD-10-CM | POA: Diagnosis not present

## 2023-09-04 MED ORDER — PHENTERMINE HCL 37.5 MG PO TABS: 37.5000 mg | ORAL_TABLET | Freq: Every day | ORAL | 2 refills | Status: DC

## 2023-09-04 NOTE — Progress Notes (Signed)
 Subjective:    Patient ID: Brenda Walker, female    DOB: 1971-01-29, 53 y.o.   MRN: [id number]  Chief Complaint  Patient presents with   Medical Management of Chronic Issues   PT calls the office today for  chronic follow up.    She is followed by Pulmonologist annually for sarcoidosis. States her breathing is stable.  She started phentermine  on her last visit. She lost 13 lb since our last visit! Her starting weight 162 lb.      09/04/2023   11:17 AM 05/29/2023   11:35 AM 03/23/2023    1:46 PM  Last 3 Weights  Weight (lbs) 149 lb 162 lb 3.2 oz 160 lb  Weight (kg) 67.586 kg 73.573 kg 72.576 kg     Depression        This is a chronic problem.  The current episode started more than 1 year ago.   The problem occurs intermittently.  Associated symptoms include restlessness.  Associated symptoms include no helplessness, no hopelessness and not sad.  Past treatments include SSRIs - Selective serotonin reuptake inhibitors.  Past medical history includes anxiety.   Anxiety Presents for follow-up visit. Symptoms include excessive worry, irritability and restlessness. Patient reports no nervous/anxious behavior or palpitations. Symptoms occur rarely. The severity of symptoms is mild.        Review of Systems  Constitutional:  Positive for irritability.  Cardiovascular:  Negative for palpitations.  Psychiatric/Behavioral:  The patient is not nervous/anxious.   All other systems reviewed and are negative.  Family History  Problem Relation Age of Onset   Dementia Mother    Cancer Sister    Hypertension Sister    Breast cancer Sister    Heart disease Brother 69       MI   Breast cancer Paternal Aunt    Social History   Socioeconomic History   Marital status: Married    Spouse name: Not on file   Number of children: Not on file   Years of education: Not on file   Highest education level: Bachelor's degree (e.g., BA, AB, BS)  Occupational History   Not on file  Tobacco  Use   Smoking status: Never   Smokeless tobacco: Never  Vaping Use   Vaping status: Never Used  Substance and Sexual Activity   Alcohol use: Yes    Comment: 1 glass of wine and 2 beers on weekend   Drug use: No   Sexual activity: Yes    Birth control/protection: Pill    Comment: OTC  Other Topics Concern   Not on file  Social History Narrative   ** Merged History Encounter **       Social Drivers of Health   Financial Resource Strain: Low Risk  (06/01/2023)   Received from [facility]   Overall Financial Resource Strain (CARDIA)    Difficulty of Paying Living Expenses: Not hard at all  Food Insecurity: No Food Insecurity (06/01/2023)   Received from [facility]   Hunger Vital Sign    Worried About Running Out of Food in the Last Year: Never true    Ran Out of Food in the Last Year: Never true  Transportation Needs: No Transportation Needs (06/01/2023)   Received from [facility] - Transportation    Lack of Transportation (Medical): No    Lack of Transportation (Non-Medical): No  Physical Activity: Insufficiently Active (06/01/2023)   Received from [facility]   Exercise Vital Sign  Days of Exercise per Week: 3 days    Minutes of Exercise per Session: 20 min  Stress: No Stress Concern Present (06/01/2023)   Received from [facility] - Occupational Stress Questionnaire    Feeling of Stress : Not at all  Recent Concern: Stress - Stress Concern Present (03/23/2023)   [employer] [facility] - Occupational Stress Questionnaire    Feeling of Stress : To some extent  Social Connections: Socially Integrated (06/01/2023)   Received from [facility]   Social Network    How would you rate your social network (family, work, friends)?: Good participation with social networks  Recent Concern: Social Connections - Moderately Isolated (03/23/2023)   Social Connection and Isolation Panel [NHANES]    Frequency  of Communication with Friends and Family: More than three times a week    Frequency of Social Gatherings with Friends and Family: More than three times a week    Attends Religious Services: Never    Database administrator or Organizations: No    Attends Engineer, structural: Not on file    Marital Status: Married       Objective:   Physical Exam Vitals reviewed.  Constitutional:      General: She is not in acute distress.    Appearance: She is well-developed.  HENT:     Head: Normocephalic and atraumatic.     Right Ear: Tympanic membrane normal.     Left Ear: Tympanic membrane normal.  Eyes:     Pupils: Pupils are equal, round, and reactive to light.  Neck:     Thyroid : No thyromegaly.  Cardiovascular:     Rate and Rhythm: Normal rate and regular rhythm.     Heart sounds: Normal heart sounds. No murmur heard. Pulmonary:     Effort: Pulmonary effort is normal. No respiratory distress.     Breath sounds: Normal breath sounds. No wheezing.  Abdominal:     General: Bowel sounds are normal. There is no distension.     Palpations: Abdomen is soft.     Tenderness: There is no abdominal tenderness.  Musculoskeletal:        General: No tenderness. Normal range of motion.     Cervical back: Normal range of motion and neck supple.  Skin:    General: Skin is warm and dry.  Neurological:     Mental Status: She is alert and oriented to person, place, and time.     Cranial Nerves: No cranial nerve deficit.     Deep Tendon Reflexes: Reflexes are normal and symmetric.  Psychiatric:        Behavior: Behavior normal.        Thought Content: Thought content normal.        Judgment: Judgment normal.          BP 126/85   Pulse 87   Temp 97.6 F (36.4 C) (Temporal)   Ht 5\' 3"  (1.6 m)   Wt 149 lb (67.6 kg)   BMI 26.39 kg/m   Assessment & Plan:  Brenda Walker comes in today with chief complaint of Medical Management of Chronic Issues   Diagnosis and orders  addressed:  1. Generalized anxiety disorder (Primary) - CMP14+EGFR  2. Overweight (BMI 25.0-29.9) - phentermine  (ADIPEX-P ) 37.5 MG tablet; Take 1 tablet (37.5 mg total) by mouth daily before breakfast.  Dispense: 30 tablet; Refill: 2 - CMP14+EGFR  3. Weight loss counseling, encounter for - phentermine  (  ADIPEX-P ) 37.5 MG tablet; Take 1 tablet (37.5 mg total) by mouth daily before breakfast.  Dispense: 30 tablet; Refill: 2 - CMP14+EGFR   Labs pending Will continue phentermine , doing great! Encourage healthy diet and exercise  Health Maintenance reviewed Diet and exercise encouraged  Follow up plan: 3 months   Tommas Fragmin, FNP

## 2023-09-04 NOTE — Patient Instructions (Signed)

## 2023-09-05 LAB — CMP14+EGFR
ALT: 34 IU/L — ABNORMAL HIGH (ref 0–32)
AST: 26 IU/L (ref 0–40)
Albumin: 4.4 g/dL (ref 3.8–4.9)
Alkaline Phosphatase: 119 IU/L (ref 44–121)
BUN/Creatinine Ratio: 16 (ref 9–23)
BUN: 15 mg/dL (ref 6–24)
Bilirubin Total: 0.2 mg/dL (ref 0.0–1.2)
CO2: 23 mmol/L (ref 20–29)
Calcium: 10.1 mg/dL (ref 8.7–10.2)
Chloride: 99 mmol/L (ref 96–106)
Creatinine, Ser: 0.95 mg/dL (ref 0.57–1.00)
Globulin, Total: 2.6 g/dL (ref 1.5–4.5)
Glucose: 84 mg/dL (ref 70–99)
Potassium: 4.4 mmol/L (ref 3.5–5.2)
Sodium: 138 mmol/L (ref 134–144)
Total Protein: 7 g/dL (ref 6.0–8.5)
eGFR: 71 mL/min/{1.73_m2} (ref >59)

## 2023-09-06 ENCOUNTER — Ambulatory Visit: Payer: Self-pay | Admitting: Family

## 2023-09-17 ENCOUNTER — Telehealth: Admitting: Physician Assistant

## 2023-09-17 ENCOUNTER — Encounter

## 2023-09-17 DIAGNOSIS — U071 COVID-19: Secondary | ICD-10-CM | POA: Diagnosis not present

## 2023-09-17 MED ORDER — FLUTICASONE PROPIONATE 50 MCG/ACT NA SUSP: 2.0000 | Freq: Every day | NASAL | 0 refills | Status: DC

## 2023-09-17 MED ORDER — NAPROXEN 500 MG PO TABS: 500.0000 mg | ORAL_TABLET | Freq: Two times a day (BID) | ORAL | 0 refills | Status: DC

## 2023-09-17 MED ORDER — PROMETHAZINE-DM 6.25-15 MG/5ML PO SYRP: 5.0000 mL | ORAL_SOLUTION | Freq: Four times a day (QID) | ORAL | 0 refills | Status: DC | PRN

## 2023-09-17 NOTE — Progress Notes (Signed)
 E-Visit  for Positive Covid Test Result   We are sorry you are not feeling well. We are here to help!  You have tested positive for COVID-19, meaning that you were infected with the novel coronavirus and could give the virus to others.  Most people with COVID-19 have mild illness and can recover at home without medical care. Do not leave your home, except to get medical care. Do not visit public areas and do not go to places where you are unable to wear a mask. It is important that you stay home  to take care for yourself and to help protect other people in your home and community.      Isolation Instructions:   You are to isolate at home until you have been fever free for at least 24 hours without a fever-reducing medication, and symptoms have been steadily improving for 24 hours. At that time,  you can end isolation but need to mask for an additional 5 days.  If you must be around other household members who do not have symptoms, you need to make sure that both you and the family members are masking consistently with a high-quality mask.  If you note any worsening of symptoms despite treatment, please seek an in-person evaluation ASAP. If you note any significant shortness of breath or any chest pain, please seek ER evaluation. Please do not delay care!   Go to the nearest hospital ED for assessment if fever/cough/breathlessness are severe or illness seems like a threat to life.    The following symptoms may appear 2-14 days after exposure: Fever Cough Shortness of breath or difficulty breathing Chills Repeated shaking with chills Muscle pain Headache Sore throat New loss of taste or smell Fatigue Congestion or runny nose Nausea or vomiting Diarrhea  You can use medication such as I have prescribed Phenergan  DM 6.25 mg/15 mg. You make take one teaspoon / 5 ml every 4-6 hours as needed for cough, I have prescribed an anti-inflammatory - Naprosyn 500 mg. Take twice daily as needed  for fever or body aches for 2 weeks, and I have prescribed Fluticasone  nasal spray 2 sprays in each nostril one time per dayasal spray 2 sprays in each nostril one time per day  You may also take acetaminophen  (Tylenol ) as needed for fever.  HOME CARE: Only take medications as instructed by your medical team. Drink plenty of fluids and get plenty of rest. A steam or ultrasonic humidifier can help if you have congestion.   GET HELP RIGHT AWAY IF YOU HAVE EMERGENCY WARNING SIGNS.  Call 911 or proceed to your closest emergency facility if: You develop worsening high fever. Trouble breathing Bluish lips or face Persistent pain or pressure in the chest New confusion Inability to wake or stay awake You cough up blood. Your symptoms become more severe Inability to hold down food or fluids  This list is not all possible symptoms. Contact your medical provider for any symptoms that are severe or concerning to you.   Your e-visit answers were reviewed by a board certified advanced clinical practitioner to complete your personal care plan.  Depending on the condition, your plan could have included both over the counter or prescription medications.  If there is a problem please reply once you have received a response from your provider.  Your safety is important to us .  If you have drug allergies check your prescription carefully.    You can use MyChart to ask questions about today's visit,  request a non-urgent call back, or ask for a work or school excuse for 24 hours related to this e-Visit. If it has been greater than 24 hours you will need to follow up with your provider, or enter a new e-Visit to address those concerns. You will get an e-mail in the next two days asking about your experience.  I hope that your e-visit has been valuable and will speed your recovery. Thank you for using e-visits.      I have spent 5 minutes in review of e-visit questionnaire, review and updating patient  chart, medical decision making and response to patient.   Brenda Walker CHRISTELLA Dickinson, PA-C,

## 2023-09-19 ENCOUNTER — Telehealth: Admitting: Physician Assistant

## 2023-09-19 ENCOUNTER — Telehealth: Payer: Self-pay

## 2023-09-19 DIAGNOSIS — Z20822 Contact with and (suspected) exposure to covid-19: Secondary | ICD-10-CM

## 2023-09-19 MED ORDER — NIRMATRELVIR/RITONAVIR (PAXLOVID)TABLET: 3.0000 | ORAL_TABLET | Freq: Two times a day (BID) | ORAL | 0 refills | Status: AC

## 2023-09-19 NOTE — Patient Instructions (Signed)
 Brenda Walker, thank you for joining Brenda GORMAN Snuffer, PA-C for today's virtual visit.  While this provider is not your primary care provider (PCP), if your PCP is located in our provider database this encounter information will be shared with them immediately following your visit.   A [facility] MyChart account gives you access to today's visit and all your visits, tests, and labs performed at [facility]  click here if you don't have a [facility] MyChart account or go to [email or web address]  Consent: (Patient) Brenda Walker provided verbal consent for this virtual visit at the beginning of the encounter.  Current Medications:  Current Outpatient Medications:    nirmatrelvir/ritonavir (PAXLOVID) 20 x 150 MG & 10 x 100MG  TABS, Take 3 tablets by mouth 2 (two) times daily for 5 days., Disp: 30 tablet, Rfl: 0   acetaminophen  (TYLENOL ) 500 MG tablet, Take 500 mg by mouth every 6 (six) hours as needed (for headaches/pain.). , Disp: , Rfl:    cetirizine (ZYRTEC) 10 MG tablet, Take 10 mg by mouth at bedtime. , Disp: , Rfl:    escitalopram  (LEXAPRO ) 10 MG tablet, Take 1 tablet (10 mg total) by mouth daily. **NEEDS TO BE SEEN BEFORE NEXT REFILL**, Disp: 90 tablet, Rfl: 3   fluticasone  (FLONASE ) 50 MCG/ACT nasal spray, Place 2 sprays into both nostrils daily., Disp: 16 g, Rfl: 0   hydrocortisone  (ANUSOL -HC) 25 MG suppository, Place 1 suppository (25 mg total) rectally 2 (two) times daily., Disp: 12 suppository, Rfl: 0   naproxen (NAPROSYN) 500 MG tablet, Take 1 tablet (500 mg total) by mouth 2 (two) times daily with a meal., Disp: 30 tablet, Rfl: 0   phentermine  (ADIPEX-P ) 37.5 MG tablet, Take 1 tablet (37.5 mg total) by mouth daily before breakfast., Disp: 30 tablet, Rfl: 2   promethazine -dextromethorphan (PROMETHAZINE -DM) 6.25-15 MG/5ML syrup, Take 5 mLs by mouth 4 (four) times daily as needed., Disp: 118 mL, Rfl: 0   triamcinolone  cream (KENALOG ) 0.1 %, APPLY TOPICALLY AS  NEEDED FOR RASH, Disp: 60 g, Rfl: 1   Medications ordered in this encounter:  Meds ordered this encounter  Medications   nirmatrelvir/ritonavir (PAXLOVID) 20 x 150 MG & 10 x 100MG  TABS    Sig: Take 3 tablets by mouth 2 (two) times daily for 5 days.    Dispense:  30 tablet    Refill:  0     *If you need refills on other medications prior to your next appointment, please contact your pharmacy*  Follow-Up: Call back or seek an in-person evaluation if the symptoms worsen or if the condition fails to improve as anticipated.  [facility] [phone]  Other Instructions Take paxlovid as directed   Follow up with your regular doctor in 1 week for reassessment and seek care sooner if your symptoms worsen or fail to improve.    If you have been instructed to have an in-person evaluation today at a local Urgent Care facility, please use the link below. It will take you to a list of all of our available [facility], including address, phone number and hours of operation. Please do not delay care.  [facility]  If you or a family member do not have a primary care provider, use the link below to schedule a visit and establish care. When you choose a [facility] primary care physician or advanced practice provider, you gain a long-term partner in health. Find a Primary Care Provider  Learn more  about [facility]'s in-office and virtual care options:  - Get Care Now
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
## 2023-09-19 NOTE — Telephone Encounter (Signed)
 Called patient in regard to the COVID survey completed in Mychart for symptoms of cough and weakness. No answer, provided care instruction through Mychart.

## 2023-10-16 ENCOUNTER — Telehealth: Admitting: Physician Assistant

## 2023-10-16 DIAGNOSIS — J019 Acute sinusitis, unspecified: Secondary | ICD-10-CM | POA: Diagnosis not present

## 2023-10-16 DIAGNOSIS — B9789 Other viral agents as the cause of diseases classified elsewhere: Secondary | ICD-10-CM | POA: Diagnosis not present

## 2023-10-16 MED ORDER — PREDNISONE 10 MG (21) PO TBPK: ORAL_TABLET | ORAL | 0 refills | Status: DC

## 2023-10-16 NOTE — Progress Notes (Signed)
 I have spent 5 minutes in review of e-visit questionnaire, review and updating patient chart, medical decision making and response to patient.   Piedad Climes, PA-C

## 2023-10-16 NOTE — Progress Notes (Signed)
 E-Visit for Sinus Problems  We are sorry that you are not feeling well.  Here is how we plan to help!  Based on what you have shared with me it looks like you have sinusitis.  Sinusitis is inflammation and infection in the sinus cavities of the head.  Based on your presentation I believe you most likely have Acute Viral Sinusitis.This is an infection most likely caused by a virus. There is not specific treatment for viral sinusitis other than to help you with the symptoms until the infection runs its course.  You may use an oral decongestant such as Mucinex D or if you have glaucoma or high blood pressure use plain Mucinex. Saline nasal spray help and can safely be used as often as needed for congestion, I have prescribed: a prednisone  pack to take as directed to reduce sinus inflammation and hopefully speed recovery.  Some authorities believe that zinc sprays or the use of Echinacea may shorten the course of your symptoms.  Sinus infections are not as easily transmitted as other respiratory infection, however we still recommend that you avoid close contact with loved ones, especially the very young and elderly.  Remember to wash your hands thoroughly throughout the day as this is the number one way to prevent the spread of infection!  Home Care: Only take medications as instructed by your medical team. Do not take these medications with alcohol. A steam or ultrasonic humidifier can help congestion.  You can place a towel over your head and breathe in the steam from hot water  coming from a faucet. Avoid close contacts especially the very young and the elderly. Cover your mouth when you cough or sneeze. Always remember to wash your hands.  Get Help Right Away If: You develop worsening fever or sinus pain. You develop a severe head ache or visual changes. Your symptoms persist after you have completed your treatment plan.  Make sure you Understand these instructions. Will watch your  condition. Will get help right away if you are not doing well or get worse.   Thank you for choosing an e-visit.  Your e-visit answers were reviewed by a board certified advanced clinical practitioner to complete your personal care plan. Depending upon the condition, your plan could have included both over the counter or prescription medications.  Please review your pharmacy choice. Make sure the pharmacy is open so you can pick up prescription now. If there is a problem, you may contact your provider through [employer] and have the prescription routed to another pharmacy.  Your safety is important to us . If you have drug allergies check your prescription carefully.   For the next 24 hours you can use MyChart to ask questions about today's visit, request a non-urgent call back, or ask for a work or school excuse. You will get an email in the next two days asking about your experience. I hope that your e-visit has been valuable and will speed your recovery.

## 2023-10-23 ENCOUNTER — Telehealth: Admitting: Physician Assistant

## 2023-10-23 DIAGNOSIS — J019 Acute sinusitis, unspecified: Secondary | ICD-10-CM | POA: Diagnosis not present

## 2023-10-23 DIAGNOSIS — B9689 Other specified bacterial agents as the cause of diseases classified elsewhere: Secondary | ICD-10-CM

## 2023-10-23 MED ORDER — DOXYCYCLINE HYCLATE 100 MG PO TABS: 100.0000 mg | ORAL_TABLET | Freq: Two times a day (BID) | ORAL | 0 refills | Status: DC

## 2023-10-23 NOTE — Progress Notes (Signed)
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
## 2023-11-14 ENCOUNTER — Ambulatory Visit: Admitting: Gastroenterology

## 2023-11-14 ENCOUNTER — Ambulatory Visit: Payer: Self-pay | Admitting: Gastroenterology

## 2023-11-14 ENCOUNTER — Encounter: Payer: Self-pay | Admitting: Gastroenterology

## 2023-11-14 ENCOUNTER — Other Ambulatory Visit (INDEPENDENT_AMBULATORY_CARE_PROVIDER_SITE_OTHER)

## 2023-11-14 VITALS — BP 128/80 | HR 101 | Ht 63.0 in | Wt 143.5 lb

## 2023-11-14 DIAGNOSIS — K6289 Other specified diseases of anus and rectum: Secondary | ICD-10-CM

## 2023-11-14 DIAGNOSIS — K648 Other hemorrhoids: Secondary | ICD-10-CM | POA: Diagnosis not present

## 2023-11-14 DIAGNOSIS — R748 Abnormal levels of other serum enzymes: Secondary | ICD-10-CM

## 2023-11-14 DIAGNOSIS — K625 Hemorrhage of anus and rectum: Secondary | ICD-10-CM | POA: Diagnosis not present

## 2023-11-14 DIAGNOSIS — K644 Residual hemorrhoidal skin tags: Secondary | ICD-10-CM | POA: Diagnosis not present

## 2023-11-14 DIAGNOSIS — K649 Unspecified hemorrhoids: Secondary | ICD-10-CM

## 2023-11-14 LAB — COMPREHENSIVE METABOLIC PANEL WITH GFR
ALT: 57 U/L — ABNORMAL HIGH (ref 0–35)
AST: 40 U/L — ABNORMAL HIGH (ref 0–37)
Albumin: 4.5 g/dL (ref 3.5–5.2)
Alkaline Phosphatase: 110 U/L (ref 39–117)
BUN: 12 mg/dL (ref 6–23)
CO2: 30 meq/L (ref 19–32)
Calcium: 10.3 mg/dL (ref 8.4–10.5)
Chloride: 100 meq/L (ref 96–112)
Creatinine, Ser: 0.9 mg/dL (ref 0.40–1.20)
GFR: 71.96 mL/min (ref >60.00)
Glucose, Bld: 98 mg/dL (ref 70–99)
Potassium: 4.3 meq/L (ref 3.5–5.1)
Sodium: 137 meq/L (ref 135–145)
Total Bilirubin: 0.4 mg/dL (ref 0.2–1.2)
Total Protein: 7.8 g/dL (ref 6.0–8.3)

## 2023-11-14 LAB — CBC WITH DIFFERENTIAL/PLATELET
Basophils Absolute: 0 K/uL (ref 0.0–0.1)
Basophils Relative: 0.8 % (ref 0.0–3.0)
Eosinophils Absolute: 0.1 K/uL (ref 0.0–0.7)
Eosinophils Relative: 1.7 % (ref 0.0–5.0)
HCT: 42.1 % (ref 36.0–46.0)
Hemoglobin: 13.8 g/dL (ref 12.0–15.0)
Lymphocytes Relative: 18.6 % (ref 12.0–46.0)
Lymphs Abs: 0.8 K/uL (ref 0.7–4.0)
MCHC: 32.9 g/dL (ref 30.0–36.0)
MCV: 91.7 fl (ref 78.0–100.0)
Monocytes Absolute: 0.4 K/uL (ref 0.1–1.0)
Monocytes Relative: 8.1 % (ref 3.0–12.0)
Neutro Abs: 3.2 K/uL (ref 1.4–7.7)
Neutrophils Relative %: 70.8 % (ref 43.0–77.0)
Platelets: 326 K/uL (ref 150.0–400.0)
RBC: 4.59 Mil/uL (ref 3.87–5.11)
RDW: 14.6 % (ref 11.5–15.5)
WBC: 4.5 K/uL (ref 4.0–10.5)

## 2023-11-14 MED ORDER — HYDROCORTISONE ACETATE 25 MG RE SUPP: 25.0000 mg | Freq: Every day | RECTAL | 0 refills | Status: AC

## 2023-11-14 NOTE — Patient Instructions (Addendum)
 We have sent the following medications to your pharmacy for you to pick up at your convenience: hydrocortisone  suppositories  Your provider has requested that you go to the basement level for lab work before leaving today. Press B on the elevator. The lab is located at the first door on the left as you exit the elevator.   _______________________________________________________  If your blood pressure at your visit was 140/90 or greater, please contact your primary care physician to follow up on this.  _______________________________________________________  If you are age 55 or older, your body mass index should be between 23-30. Your Body mass index is 25.42 kg/m. If this is out of the aforementioned range listed, please consider follow up with your Primary Care Provider.  If you are age 67 or younger, your body mass index should be between 19-25. Your Body mass index is 25.42 kg/m. If this is out of the aformentioned range listed, please consider follow up with your Primary Care Provider.   ________________________________________________________  The [facility] providers would like to encourage you to use MYCHART to communicate with providers for non-urgent requests or questions.  Due to long hold times on the telephone, sending your provider a message by [facility] may be a faster and more efficient way to get a response.  Please allow 48 business hours for a response.  Please remember that this is for non-urgent requests.  _______________________________________________________  [facility] is using a team-based approach to care.  Your team is made up of your doctor and two to three APPS. Our APPS (Nurse Practitioners and Physician Assistants) work with your physician to ensure care continuity for you. They are fully qualified to address your health concerns and develop a treatment plan. They communicate directly with your gastroenterologist to care for you. Seeing the Advanced  Practice Practitioners on your physician's team can help you by facilitating care more promptly, often allowing for earlier appointments, access to diagnostic testing, procedures, and other specialty referrals.

## 2023-11-15 ENCOUNTER — Encounter: Payer: Self-pay | Admitting: Gastroenterology

## 2023-11-28 ENCOUNTER — Other Ambulatory Visit (INDEPENDENT_AMBULATORY_CARE_PROVIDER_SITE_OTHER)

## 2023-11-28 ENCOUNTER — Ambulatory Visit: Payer: Self-pay | Admitting: Physician Assistant

## 2023-11-28 DIAGNOSIS — R748 Abnormal levels of other serum enzymes: Secondary | ICD-10-CM

## 2023-11-28 LAB — HEPATIC FUNCTION PANEL
ALT: 31 U/L (ref 0–35)
AST: 25 U/L (ref 0–37)
Albumin: 4.3 g/dL (ref 3.5–5.2)
Alkaline Phosphatase: 92 U/L (ref 39–117)
Bilirubin, Direct: 0.1 mg/dL (ref 0.0–0.3)
Total Bilirubin: 0.4 mg/dL (ref 0.2–1.2)
Total Protein: 7.3 g/dL (ref 6.0–8.3)

## 2023-12-06 ENCOUNTER — Encounter: Admitting: Family

## 2023-12-11 ENCOUNTER — Other Ambulatory Visit

## 2023-12-12 ENCOUNTER — Encounter: Admitting: Internal Medicine

## 2023-12-24 ENCOUNTER — Ambulatory Visit: Admitting: Pulmonary Disease

## 2023-12-24 ENCOUNTER — Encounter: Payer: Self-pay | Admitting: Pulmonary Disease

## 2023-12-24 VITALS — BP 122/80 | HR 82 | Temp 97.9°F | Ht 63.0 in | Wt 145.0 lb

## 2023-12-24 DIAGNOSIS — D869 Sarcoidosis, unspecified: Secondary | ICD-10-CM

## 2023-12-24 NOTE — Patient Instructions (Signed)
  VISIT SUMMARY: You had a follow-up visit to check on your sarcoidosis, which has been in remission for almost five years. You have no current symptoms and do not need inhalers.  YOUR PLAN: SARCOIDOSIS IN REMISSION: Your sarcoidosis, diagnosed in 2018, is currently in remission. You have no symptoms and do not need inhalers. Your recent liver tests were normal after addressing previous slight elevations. -Order a chest CT scan to establish a baseline for any scarring or nodules from previous sarcoidosis. -Schedule a lung function test in six months.
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
## 2023-12-25 ENCOUNTER — Ambulatory Visit: Admitting: Internal Medicine

## 2023-12-25 ENCOUNTER — Encounter: Payer: Self-pay | Admitting: Internal Medicine

## 2023-12-25 VITALS — BP 110/74 | HR 80 | Ht 63.0 in | Wt 144.0 lb

## 2023-12-25 DIAGNOSIS — K648 Other hemorrhoids: Secondary | ICD-10-CM

## 2023-12-25 DIAGNOSIS — K641 Second degree hemorrhoids: Secondary | ICD-10-CM

## 2023-12-25 NOTE — Patient Instructions (Addendum)
 Unfortunately  the banding didn't work.  Use your hydrocortisone  suppostitories as needed. You may see some bleeding from the banding attempted today.  _______________________________________________________  If your blood pressure at your visit was 140/90 or greater, please contact your primary care physician to follow up on this.  _______________________________________________________  If you are age 55 or older, your body mass index should be between 23-30. Your Body mass index is 25.51 kg/m. If this is out of the aforementioned range listed, please consider follow up with your Primary Care Provider.  If you are age 98 or younger, your body mass index should be between 19-25. Your Body mass index is 25.51 kg/m. If this is out of the aformentioned range listed, please consider follow up with your Primary Care Provider.   ________________________________________________________  The [facility] providers would like to encourage you to use MYCHART to communicate with providers for non-urgent requests or questions.  Due to long hold times on the telephone, sending your provider a message by [facility] may be a faster and more efficient way to get a response.  Please allow 48 business hours for a response.  Please remember that this is for non-urgent requests.  _______________________________________________________  [facility] is using a team-based approach to care.  Your team is made up of your doctor and two to three APPS. Our APPS (Nurse Practitioners and Physician Assistants) work with your physician to ensure care continuity for you. They are fully qualified to address your health concerns and develop a treatment plan. They communicate directly with your gastroenterologist to care for you. Seeing the Advanced Practice Practitioners on your physician's team can help you by facilitating care more promptly, often allowing for earlier appointments, access to diagnostic testing,  procedures, and other specialty referrals.    I appreciate the opportunity to care for you. Lupita Commander, MD, [facility]

## 2023-12-25 NOTE — Progress Notes (Signed)
    HEMORRHOID LIGATION:  Signs and symptoms are rectal bleeding and symptoms of prolapse.  Patient was seen in the office August 2025 by Camie Furbish PA-C and bleeding internal hemorrhoids noted.  The patient used Anusol  HC suppositories and has had significant improvement with just occasional spotting.  See 11/14/2023 note for additional details.   Patti [place], CMA present.  Rectal exam shows some small old external hemorrhoids nontender no mass brown stool.  Anoscopy shows a grade 2 right posterior internal hemorrhoid with stigmata of bleeding and grade 1 left lateral and right anterior internal hemorrhoids.  PROCEDURE NOTE: The patient presents with symptomatic grade 2  hemorrhoids, requesting rubber band ligation of his/her hemorrhoidal disease.  All risks, benefits and alternative forms of therapy were described and informed consent was obtained.   The anorectum was pre-medicated with 0.125% nitroglycerin and 5% lidocaine  topical The decision was made to band the right posterior internal hemorrhoid, and the [facility] O'Regan System was used to perform band ligation without complication.    The patient was nauseous and sweaty and lightheaded after the banding.  I manipulated the band and loosened it but she had persistent symptoms after that so the band was removed.  This traumatized tissue slightly and caused slight rectal bleeding.  She was observed for 10 minutes after the removal of the band and felt well and was discharged.   She was intolerant of the banding and would not pursue this again.  She may use hydrocortisone  as needed and conservative measures for her hemorrhoids.  CC: Lavell Bari LABOR, FNP

## 2024-01-14 ENCOUNTER — Ambulatory Visit
Admission: RE | Admit: 2024-01-14 | Discharge: 2024-01-14 | Disposition: A | Discharge status: Home / Self Care | Source: Ambulatory Visit | Attending: Pulmonary Disease | Admitting: Pulmonary Disease

## 2024-01-14 DIAGNOSIS — D869 Sarcoidosis, unspecified: Secondary | ICD-10-CM

## 2024-01-17 ENCOUNTER — Ambulatory Visit: Payer: Self-pay | Admitting: Pulmonary Disease

## 2024-01-24 ENCOUNTER — Encounter: Admitting: Family

## 2024-01-24 ENCOUNTER — Other Ambulatory Visit: Payer: Self-pay | Admitting: Medical Genetics

## 2024-01-24 DIAGNOSIS — Z006 Encounter for examination for normal comparison and control in clinical research program: Secondary | ICD-10-CM

## 2024-02-05 ENCOUNTER — Ambulatory Visit (INDEPENDENT_AMBULATORY_CARE_PROVIDER_SITE_OTHER): Payer: Self-pay | Admitting: Family

## 2024-02-05 ENCOUNTER — Other Ambulatory Visit (HOSPITAL_COMMUNITY)
Admission: RE | Admit: 2024-02-05 | Discharge: 2024-02-05 | Disposition: A | Discharge status: Home / Self Care | Source: Ambulatory Visit | Attending: Family | Admitting: Family

## 2024-02-05 VITALS — BP 133/82 | HR 102 | Temp 97.6°F | Ht 63.0 in | Wt 144.8 lb

## 2024-02-05 DIAGNOSIS — Z0000 Encounter for general adult medical examination without abnormal findings: Secondary | ICD-10-CM

## 2024-02-05 DIAGNOSIS — F411 Generalized anxiety disorder: Secondary | ICD-10-CM

## 2024-02-05 DIAGNOSIS — Z01419 Encounter for gynecological examination (general) (routine) without abnormal findings: Secondary | ICD-10-CM

## 2024-02-05 DIAGNOSIS — D869 Sarcoidosis, unspecified: Secondary | ICD-10-CM

## 2024-02-05 DIAGNOSIS — Z01411 Encounter for gynecological examination (general) (routine) with abnormal findings: Secondary | ICD-10-CM | POA: Diagnosis not present

## 2024-02-05 DIAGNOSIS — Z713 Dietary counseling and surveillance: Secondary | ICD-10-CM

## 2024-02-05 DIAGNOSIS — E663 Overweight: Secondary | ICD-10-CM | POA: Diagnosis not present

## 2024-02-05 DIAGNOSIS — Z1159 Encounter for screening for other viral diseases: Secondary | ICD-10-CM

## 2024-02-05 DIAGNOSIS — J301 Allergic rhinitis due to pollen: Secondary | ICD-10-CM

## 2024-02-05 LAB — LIPID PANEL

## 2024-02-05 MED ORDER — PHENTERMINE HCL 37.5 MG PO TABS: 37.5000 mg | ORAL_TABLET | Freq: Every day | ORAL | 2 refills | Status: AC

## 2024-02-05 MED ORDER — TRIAMCINOLONE ACETONIDE 0.1 % EX CREA: TOPICAL_CREAM | CUTANEOUS | 1 refills | Status: AC

## 2024-02-05 MED ORDER — CETIRIZINE HCL 10 MG PO TABS: 10.0000 mg | ORAL_TABLET | Freq: Every day | ORAL | 4 refills | Status: AC

## 2024-02-05 MED ORDER — ESCITALOPRAM OXALATE 10 MG PO TABS: 10.0000 mg | ORAL_TABLET | Freq: Every day | ORAL | 4 refills | Status: AC

## 2024-02-05 NOTE — Patient Instructions (Signed)

## 2024-02-05 NOTE — Progress Notes (Signed)
 Subjective:    Patient ID: Brenda Walker, female    DOB: Aug 19, 1970, 53 y.o.   MRN: [id number]  Chief Complaint  Patient presents with   Annual Exam   PT calls the office today for CPE with pap.     She is followed by Pulmonologist annually for sarcoidosis. States her breathing is stable.  She started phentermine   and has  lost 18 lb since starting! Her starting weight 162 lb.      02/05/2024    2:02 PM 12/25/2023    3:11 PM 12/24/2023    3:54 PM  Last 3 Weights  Weight (lbs) 144 lb 12.8 oz 144 lb 145 lb  Weight (kg) 65.681 kg 65.318 kg 65.772 kg     Depression        This is a chronic problem.  The current episode started more than 1 year ago.   The problem occurs intermittently.  Associated symptoms include restlessness.  Associated symptoms include no helplessness, no hopelessness and not sad.  Past treatments include SSRIs - Selective serotonin reuptake inhibitors.  Past medical history includes anxiety.   Anxiety Presents for follow-up visit. Symptoms include excessive worry, irritability and restlessness. Patient reports no nervous/anxious behavior or palpitations. Symptoms occur rarely. The severity of symptoms is mild.    Gynecologic Exam The patient's pertinent negatives include no genital itching, genital odor, vaginal bleeding or vaginal discharge. The problem occurs intermittently. The treatment provided no relief.      Review of Systems  Constitutional:  Positive for irritability.  Cardiovascular:  Negative for palpitations.  Genitourinary:  Negative for vaginal discharge.  Psychiatric/Behavioral:  The patient is not nervous/anxious.   All other systems reviewed and are negative.  Family History  Problem Relation Age of Onset   Dementia Mother    Cancer Sister    Hypertension Sister    Breast cancer Sister    Heart disease Brother 57       MI   Breast cancer Paternal Aunt    Social History   Socioeconomic History   Marital status: Married     Spouse name: Not on file   Number of children: Not on file   Years of education: Not on file   Highest education level: Bachelor's degree (e.g., BA, AB, BS)  Occupational History   Not on file  Tobacco Use   Smoking status: Never   Smokeless tobacco: Never  Vaping Use   Vaping status: Never Used  Substance and Sexual Activity   Alcohol use: Yes    Comment: 1 glass of wine and 2 beers on weekend   Drug use: No   Sexual activity: Yes    Birth control/protection: Pill    Comment: OTC  Other Topics Concern   Not on file  Social History Narrative   ** Merged History Encounter **       Social Drivers of Health   Financial Resource Strain: Low Risk  (02/05/2024)   Overall Financial Resource Strain (CARDIA)    Difficulty of Paying Living Expenses: Not hard at all  Food Insecurity: No Food Insecurity (02/05/2024)   Hunger Vital Sign    Worried About Running Out of Food in the Last Year: Never true    Ran Out of Food in the Last Year: Never true  Transportation Needs: No Transportation Needs (02/05/2024)   PRAPARE - Administrator, Civil Service (Medical): No    Lack of Transportation (Non-Medical): No  Physical Activity: Insufficiently Active (02/05/2024)  Exercise Vital Sign    Days of Exercise per Week: 2 days    Minutes of Exercise per Session: 20 min  Stress: No Stress Concern Present (02/05/2024)   [employer] of Occupational Health - Occupational Stress Questionnaire    Feeling of Stress: Only a little  Social Connections: Moderately Integrated (02/05/2024)   Social Connection and Isolation Panel    Frequency of Communication with Friends and Family: More than three times a week    Frequency of Social Gatherings with Friends and Family: More than three times a week    Attends Religious Services: 1 to 4 times per year    Active Member of [employer] or Organizations: No    Attends Engineer, Structural: Not on file    Marital Status: Married        Objective:   Physical Exam Vitals reviewed.  Constitutional:      General: She is not in acute distress.    Appearance: She is well-developed.  HENT:     Head: Normocephalic and atraumatic.     Right Ear: Tympanic membrane normal.     Left Ear: Tympanic membrane normal.  Eyes:     Pupils: Pupils are equal, round, and reactive to light.  Neck:     Thyroid : No thyromegaly.  Cardiovascular:     Rate and Rhythm: Normal rate and regular rhythm.     Heart sounds: Normal heart sounds. No murmur heard. Pulmonary:     Effort: Pulmonary effort is normal. No respiratory distress.     Breath sounds: Normal breath sounds. No wheezing.  Abdominal:     General: Bowel sounds are normal. There is no distension.     Palpations: Abdomen is soft.     Tenderness: There is no abdominal tenderness.  Genitourinary:    Comments: Bimanual exam- no adnexal masses or tenderness, ovaries nonpalpable   Cervix parous and pink- No discharge  Musculoskeletal:        General: No tenderness. Normal range of motion.     Cervical back: Normal range of motion and neck supple.  Skin:    General: Skin is warm and dry.  Neurological:     Mental Status: She is alert and oriented to person, place, and time.     Cranial Nerves: No cranial nerve deficit.     Deep Tendon Reflexes: Reflexes are normal and symmetric.  Psychiatric:        Behavior: Behavior normal.        Thought Content: Thought content normal.        Judgment: Judgment normal.          BP 133/82   Pulse (!) 102   Temp 97.6 F (36.4 C) (Temporal)   Ht 5' 3 (1.6 m)   Wt 144 lb 12.8 oz (65.7 kg)   BMI 25.65 kg/m   Assessment & Plan:  [name] comes in today with chief complaint of Annual Exam   Diagnosis and orders addressed:  1. Annual physical exam (Primary) - CMP14+EGFR - CBC with Differential/Platelet - Lipid panel - TSH - Hepatitis B surface antibody,quantitative - Cytology - PAP([place])  2. Gynecologic  exam normal - CMP14+EGFR - CBC with Differential/Platelet - Cytology - PAP([place])  3. Overweight (BMI 25.0-29.9)  - phentermine  (ADIPEX-P ) 37.5 MG tablet; Take 1 tablet (37.5 mg total) by mouth daily before breakfast.  Dispense: 30 tablet; Refill: 2 - CMP14+EGFR - CBC with Differential/Platelet  4. Weight loss counseling, encounter for - phentermine  (ADIPEX-P )  37.5 MG tablet; Take 1 tablet (37.5 mg total) by mouth daily before breakfast.  Dispense: 30 tablet; Refill: 2 - CMP14+EGFR - CBC with Differential/Platelet - TSH  5. Sarcoidosis - CMP14+EGFR - CBC with Differential/Platelet  6. Generalized anxiety disorder - escitalopram  (LEXAPRO ) 10 MG tablet; Take 1 tablet (10 mg total) by mouth daily.  Dispense: 90 tablet; Refill: 4 - CMP14+EGFR - CBC with Differential/Platelet  7. Allergic rhinitis due to pollen, unspecified seasonality - cetirizine (ZYRTEC) 10 MG tablet; Take 1 tablet (10 mg total) by mouth at bedtime.  Dispense: 90 tablet; Refill: 4 - CMP14+EGFR - CBC with Differential/Platelet  8. Need for hepatitis B screening test - CMP14+EGFR - CBC with Differential/Platelet - Hepatitis B surface antibody,quantitative   Labs pending Will continue phentermine , doing great! Encourage healthy diet and exercise  Health Maintenance reviewed Diet and exercise encouraged  Follow up plan: 3 months   Bari Learn, FNP

## 2024-02-06 LAB — CBC WITH DIFFERENTIAL/PLATELET
Basophils Absolute: 0.1 x10E3/uL (ref 0.0–0.2)
Basos: 1 %
EOS (ABSOLUTE): 0.1 x10E3/uL (ref 0.0–0.4)
Eos: 1 %
Hematocrit: 42.1 % (ref 34.0–46.6)
Hemoglobin: 13.8 g/dL (ref 11.1–15.9)
Immature Grans (Abs): 0 x10E3/uL (ref 0.0–0.1)
Immature Granulocytes: 0 %
Lymphocytes Absolute: 1 x10E3/uL (ref 0.7–3.1)
Lymphs: 19 %
MCH: 30.8 pg (ref 26.6–33.0)
MCHC: 32.8 g/dL (ref 31.5–35.7)
MCV: 94 fL (ref 79–97)
Monocytes Absolute: 0.5 x10E3/uL (ref 0.1–0.9)
Monocytes: 9 %
Neutrophils Absolute: 3.6 x10E3/uL (ref 1.4–7.0)
Neutrophils: 70 %
Platelets: 299 x10E3/uL (ref 150–450)
RBC: 4.48 x10E6/uL (ref 3.77–5.28)
RDW: 12.6 % (ref 11.7–15.4)
WBC: 5.2 x10E3/uL (ref 3.4–10.8)

## 2024-02-06 LAB — CMP14+EGFR
ALT: 39 IU/L — AB (ref 0–32)
AST: 28 IU/L (ref 0–40)
Albumin: 4.4 g/dL (ref 3.8–4.9)
Alkaline Phosphatase: 125 IU/L (ref 49–135)
BUN/Creatinine Ratio: 18 (ref 9–23)
BUN: 19 mg/dL (ref 6–24)
Bilirubin Total: 0.3 mg/dL (ref 0.0–1.2)
CO2: 22 mmol/L (ref 20–29)
Calcium: 10.2 mg/dL (ref 8.7–10.2)
Chloride: 99 mmol/L (ref 96–106)
Creatinine, Ser: 1.08 mg/dL — AB (ref 0.57–1.00)
Globulin, Total: 2.4 g/dL (ref 1.5–4.5)
Glucose: 83 mg/dL (ref 70–99)
Potassium: 4.7 mmol/L (ref 3.5–5.2)
Sodium: 136 mmol/L (ref 134–144)
Total Protein: 6.8 g/dL (ref 6.0–8.5)
eGFR: 61 mL/min/1.73 (ref >59)

## 2024-02-06 LAB — LIPID PANEL
Cholesterol, Total: 217 mg/dL — AB (ref 100–199)
HDL: 76 mg/dL (ref >39)
LDL CALC COMMENT:: 2.9 ratio (ref 0.0–4.4)
LDL Chol Calc (NIH): 125 mg/dL — AB (ref 0–99)
Triglycerides: 93 mg/dL (ref 0–149)
VLDL Cholesterol Cal: 16 mg/dL (ref 5–40)

## 2024-02-06 LAB — TSH: TSH: 1.31 u[IU]/mL (ref 0.450–4.500)

## 2024-02-06 LAB — HEPATITIS B SURFACE ANTIBODY, QUANTITATIVE: Hepatitis B Surf Ab Quant: <3.5 m[IU]/mL — AB

## 2024-02-07 ENCOUNTER — Ambulatory Visit: Payer: Self-pay | Admitting: Family

## 2024-02-11 LAB — CYTOLOGY - PAP
Adequacy: ABSENT
Comment: NEGATIVE
Diagnosis: UNDETERMINED — AB
High risk HPV: NEGATIVE

## 2024-04-17 ENCOUNTER — Ambulatory Visit (INDEPENDENT_AMBULATORY_CARE_PROVIDER_SITE_OTHER): Payer: Self-pay | Admitting: Family Medicine

## 2024-04-17 ENCOUNTER — Encounter: Payer: Self-pay | Admitting: Family Medicine

## 2024-04-17 VITALS — BP 115/76 | HR 80 | Temp 97.9°F | Ht 63.0 in | Wt 143.0 lb

## 2024-04-17 DIAGNOSIS — M7551 Bursitis of right shoulder: Secondary | ICD-10-CM

## 2024-04-17 MED ORDER — MELOXICAM 15 MG PO TABS: 15.0000 mg | ORAL_TABLET | Freq: Every day | ORAL | 0 refills | Status: AC

## 2024-04-17 NOTE — Progress Notes (Signed)
" ° °  Acute Office Visit  Subjective:     Patient ID: Brenda Walker, female    DOB: [birth year], 54 y.o.   MRN: [id number]  Chief Complaint  Patient presents with   Shoulder Pain    HPI  History of Present Illness   Brenda Walker is a 54 year old female who presents with a knot on her right shoulder and associated pain.  Right shoulder mass and pain - Knot on the top of the right shoulder present for a couple of months - Onset after remodeling house and lifting heavy items repeatedly  - Initially painless with sensation of weakness in shoulder - Knot became more pronounced about one month ago but has not increased in size since then - Pain localized to the right shoulder, particularly with lifting or reaching overhead - Arm feels weak during these activities - Pain is intermittent and activity-related - Ibuprofen  alleviates the ache but is not taken regularly - No numbness, tingling, or tenderness to touch in the affected area - No prior or recent injury to the shoulder       ROS As per HPI.      Objective:    BP 115/76   Pulse 80   Temp 97.9 F (36.6 C) (Temporal)   Ht 5' 3 (1.6 m)   Wt 143 lb (64.9 kg)   SpO2 100%   BMI 25.33 kg/m    Physical Exam Vitals and nursing note reviewed.  Constitutional:      General: She is not in acute distress.    Appearance: She is not ill-appearing, toxic-appearing or diaphoretic.  Pulmonary:     Effort: Pulmonary effort is normal. No respiratory distress.  Musculoskeletal:     Right shoulder: Swelling (AC, mobile cyst palpated with tenderness. No erythema or warmth. Pain with movement) present.  Skin:    General: Skin is warm and dry.  Neurological:     General: No focal deficit present.     Mental Status: She is alert and oriented to person, place, and time.  Psychiatric:        Mood and Affect: Mood normal.        Behavior: Behavior normal.     No results found for any visits on 04/17/24.       Assessment & Plan:   Brenda Walker was seen today for shoulder pain.  Diagnoses and all orders for this visit:  Bursitis of right shoulder -     Cancel: Ambulatory referral to Orthopedic Surgery -     meloxicam  (MOBIC ) 15 MG tablet; Take 1 tablet (15 mg total) by mouth daily. -     Ambulatory referral to Orthopedic Surgery   Assessment and Plan    Acute right shoulder pain ? shoulder bursitis  - Prescribed meloxicam  daily for inflammation and pain relief. - Advised against taking Advil , ibuprofen , or Aleve  while on meloxicam ; Tylenol  is permissible. - Referred to orthopedic specialist for further evaluation and potential steroid injection.      Return to office for new or worsening symptoms, or if symptoms persist.   The patient indicates understanding of these issues and agrees with the plan.  Brenda Walker Search, FNP   "
